# Patient Record
Sex: Male | Born: 1960
Health system: Southern US, Community
[De-identification: ages and names within clinical notes are randomized; demographics above are authoritative.]

## PROBLEM LIST (undated history)

## (undated) ENCOUNTER — Ambulatory Visit

## (undated) ENCOUNTER — Ambulatory Visit: Attending: Pharmacist | Primary: Pharmacist

## (undated) DIAGNOSIS — H539 Unspecified visual disturbance: Secondary | ICD-10-CM

## (undated) DIAGNOSIS — F909 Attention-deficit hyperactivity disorder, unspecified type: Secondary | ICD-10-CM

## (undated) DIAGNOSIS — A549 Gonococcal infection, unspecified: Secondary | ICD-10-CM

## (undated) DIAGNOSIS — Z7251 High risk heterosexual behavior: Principal | ICD-10-CM

## (undated) DIAGNOSIS — R51 Headache: Secondary | ICD-10-CM

## (undated) DIAGNOSIS — Z701 Counseling related to patient's sexual behavior and orientation: Secondary | ICD-10-CM

## (undated) DIAGNOSIS — E039 Hypothyroidism, unspecified: Secondary | ICD-10-CM

## (undated) DIAGNOSIS — Z206 Contact with and (suspected) exposure to human immunodeficiency virus [HIV]: Secondary | ICD-10-CM

## (undated) DIAGNOSIS — Z113 Encounter for screening for infections with a predominantly sexual mode of transmission: Secondary | ICD-10-CM

## (undated) DIAGNOSIS — R519 Headache, unspecified: Secondary | ICD-10-CM

## (undated) DIAGNOSIS — E785 Hyperlipidemia, unspecified: Secondary | ICD-10-CM

## (undated) DIAGNOSIS — R7989 Other specified abnormal findings of blood chemistry: Secondary | ICD-10-CM

## (undated) HISTORY — DX: Attention-deficit hyperactivity disorder, unspecified type: F90.9

## (undated) HISTORY — DX: Hyperlipidemia, unspecified: E78.5

## (undated) HISTORY — DX: Gonococcal infection, unspecified: A54.9

## (undated) HISTORY — DX: Hypothyroidism, unspecified: E03.9

## (undated) HISTORY — DX: Headache: R51

## (undated) HISTORY — PX: TONSILLECTOMY AND ADENOIDECTOMY: SUR1326

## (undated) HISTORY — DX: High risk heterosexual behavior: Z72.51

## (undated) HISTORY — DX: Counseling related to patient's sexual behavior and orientation: Z70.1

## (undated) HISTORY — DX: Unspecified visual disturbance: H53.9

## (undated) HISTORY — DX: Other specified abnormal findings of blood chemistry: R79.89

## (undated) HISTORY — PX: NASAL SINUS SURGERY: SHX719

## (undated) HISTORY — DX: Encounter for screening for infections with a predominantly sexual mode of transmission: Z11.3

## (undated) HISTORY — DX: Headache, unspecified: R51.9

## (undated) HISTORY — DX: Contact with and (suspected) exposure to human immunodeficiency virus (hiv): Z20.6

---

## 2011-09-06 DIAGNOSIS — L570 Actinic keratosis: Secondary | ICD-10-CM | POA: Insufficient documentation

## 2011-09-06 DIAGNOSIS — D229 Melanocytic nevi, unspecified: Secondary | ICD-10-CM | POA: Insufficient documentation

## 2014-08-25 ENCOUNTER — Ambulatory Visit (INDEPENDENT_AMBULATORY_CARE_PROVIDER_SITE_OTHER): Payer: BLUE CROSS/BLUE SHIELD | Admitting: Neurology

## 2014-08-25 ENCOUNTER — Encounter: Payer: Self-pay | Admitting: Neurology

## 2014-08-25 VITALS — BP 110/72 | HR 72 | Resp 14 | Ht 72.0 in | Wt 168.2 lb

## 2014-08-25 DIAGNOSIS — M542 Cervicalgia: Secondary | ICD-10-CM | POA: Diagnosis not present

## 2014-08-25 DIAGNOSIS — M545 Low back pain, unspecified: Secondary | ICD-10-CM

## 2014-08-25 DIAGNOSIS — R200 Anesthesia of skin: Secondary | ICD-10-CM | POA: Diagnosis not present

## 2014-08-25 DIAGNOSIS — M79605 Pain in left leg: Secondary | ICD-10-CM

## 2014-08-25 DIAGNOSIS — M4120 Other idiopathic scoliosis, site unspecified: Secondary | ICD-10-CM | POA: Diagnosis not present

## 2014-08-25 MED ORDER — METHYLPREDNISOLONE 4 MG PO TBPK: ORAL_TABLET | ORAL | Status: DC

## 2014-08-25 NOTE — Progress Notes (Signed)
[facility]  PATIENT: Richard Hahn DOB: Aug 08, 1960  REFERRING DOCTOR OR PCP:  Sandi Mariscal SOURCE: patient  _________________________________   HISTORICAL  CHIEF COMPLAINT:  Chief Complaint  Patient presents with  . Back Pain    [name] has a Hx. of scoliosis.  He is here today with c/o left lower back pain, upper back and neck pain.  Sts. yesterday he was standing still for about 10-15 min, when he went to move he  had onset of dull ache, tingling entire left leg to foot.  Sx. are improved but still present today.  He has a hx. of scoliosis--unsure of degree of curvature--and wonders if some of his back and neck pain is due to that/fim    HISTORY OF PRESENT ILLNESS:  I had the pleasure seeing your patient, Richard Hahn, at [facility] for neurologic consultation regarding his left leg numbness and weakness.   He is a 54 year old man in good general health who had the sudden onset left leg venous and numbness yesterday. He was standing up when he took a step forward and the left leg collapsed and he fell he felt severe numbness going down the left leg all the way to the foot this was associated with a dull ache but not significant pain. Numbness has persisted but is much less intense today than it was yesterday. He currently denies any weakness and just has a minimal sensation of numbness in the left leg but does not note any actual numbness when he touches the leg. He has not noted any change in bladder or bowel.  He also notes mild neck pain with some neck spasticity. The symptoms have been more constant recently it had not changed yesterday. He occasionally notes a grinding sound in the upper back when he moves.  He has a history of scoliosis and occasionally get some back discomfort. He has never had any spinal procedures  REVIEW OF SYSTEMS: Constitutional: No fevers, chills, sweats, or change in appetite Eyes: No visual changes, double vision, eye  pain Ear, nose and throat: No hearing loss, ear pain, nasal congestion, sore throat Cardiovascular: No chest pain, palpitations Respiratory: No shortness of breath at rest or with exertion.   No wheezes GastrointestinaI: No nausea, vomiting, diarrhea, abdominal pain, fecal incontinence Genitourinary: No dysuria, urinary retention or frequency.  No nocturia. Musculoskeletal: No neck pain, back pain Integumentary: No rash, pruritus, skin lesions Neurological: as above Psychiatric: No depression at this time.  No anxiety Endocrine: No palpitations, diaphoresis, change in appetite, change in weigh or increased thirst Hematologic/Lymphatic: No anemia, purpura, petechiae. Allergic/Immunologic: No itchy/runny eyes, nasal congestion, recent allergic reactions, rashes  ALLERGIES: Allergies  Allergen Reactions  . Penicillins Rash    HOME MEDICATIONS:  Current outpatient prescriptions:  .  ALPRAZolam (XANAX) 0.5 MG tablet, Take 0.5 mg by mouth at bedtime., Disp: , Rfl: 1 .  cyanocobalamin 100 MCG tablet, Take 100 mcg by mouth daily., Disp: , Rfl:  .  doxycycline (DORYX) 100 MG EC tablet, TAKE 1 (ONE) TABLET DR BY MOUTH TWO TIMES DAILY, Disp: , Rfl: 0 .  fluticasone (FLONASE) 50 MCG/ACT nasal spray, INSTILL 2 SPRAYS IN EACH NOSTRIL DAILY, Disp: , Rfl: 5 .  ipratropium (ATROVENT) 0.03 % nasal spray, USE 1 NASALLY TWICE DAILY, Disp: , Rfl: 5 .  Multiple Vitamin (MULTIVITAMIN) tablet, Take 1 tablet by mouth daily., Disp: , Rfl:  .  NATURE-THROID 32.5 MG tablet, TAKE TWICE DAILY ON EMPTY STOMACH, Disp: , Rfl: 3 .  omega-3 acid ethyl esters (LOVAZA) 1 G capsule, Take 2 capsules by mouth 2 (two) times daily., Disp: , Rfl: 3 .  temazepam (RESTORIL) 30 MG capsule, TK 1 C PO QD HS, Disp: , Rfl: 2 .  testosterone cypionate (DEPOTESTOTERONE CYPIONATE) 200 MG/ML injection, INJECT 0.8MLS INTO LATERAL THIGH ONCE WEEKLY, Disp: , Rfl: 1  PAST MEDICAL HISTORY: Past Medical History  Diagnosis Date  .  Headache   . Vision abnormalities     PAST SURGICAL HISTORY: Past Surgical History  Procedure Laterality Date  . Nasal sinus surgery    . Tonsillectomy and adenoidectomy      FAMILY HISTORY: Family History  Problem Relation Age of Onset  . High Cholesterol Mother   . High Cholesterol Father   . Skin cancer Father     SOCIAL HISTORY:  History   Social History  . Marital Status: Unknown    Spouse Name: N/A  . Number of Children: N/A  . Years of Education: N/A   Occupational History  . Not on file.   Social History Main Topics  . Smoking status: Never Smoker   . Smokeless tobacco: Not on file  . Alcohol Use: No  . Drug Use: No  . Sexual Activity: Not on file   Other Topics Concern  . Not on file   Social History Narrative  . No narrative on file     PHYSICAL EXAM  Filed Vitals:   08/25/14 1526  BP: 110/72  Pulse: 72  Resp: 14  Height: 6' (1.829 m)  Weight: 168 lb 3.2 oz (76.295 kg)    Body mass index is 22.81 kg/(m^2).   General: The patient is well-developed and well-nourished and in no acute distress  Neck: The neck is supple, no carotid bruits are noted.  The neck is nontender.  Cardiovascular: The heart has a regular rate and rhythm with a normal S1 and S2. There were no murmurs, gallops or rubs.   Skin: Extremities are without significant edema.  Musculoskeletal:  Back is nontender.   He has scoliosis most notable in the upper mid lumbar spine that is convex to the left and there is more gentle scoliosis in the thoracic spine convex to the right. The right hip is elevated, compared to the left.  Neurologic Exam  Mental status: The patient is alert and oriented x 3 at the time of the examination. The patient has apparent normal recent and remote memory, with an apparently normal attention span and concentration ability.   Speech is normal.  Cranial nerves: Extraocular movements are full.  Facial symmetry is present.  Trapezius and  sternocleidomastoid strength is normal. No dysarthria is noted.   No obvious hearing deficits are noted.  Motor:  Muscle bulk is normal.   Tone is normal. Strength is  5 / 5 in all 4 extremities.   Sensory: Sensory testing is intact to   touch and vibration sensation in all 4 extremities.  Coordination: Cerebellar testing reveals good finger-nose-finger bilaterally.  Gait and station: Station is normal.   Gait is normal. Tandem gait is normal. Romberg is negative.   Reflexes: Deep tendon reflexes are symmetric in the arms, the left knee reflex is brisker than the right and there is some spread to the other leg. The ankle reflexes are more symmetric.   Plantar responses are flexor.    DIAGNOSTIC DATA (LABS, IMAGING, TESTING) - I reviewed patient records, labs, notes, testing and imaging myself where available.     ASSESSMENT AND  PLAN  Neck pain - Plan: MR Cervical Spine Wo Contrast  Idiopathic scoliosis  Numbness - Plan: MR Lumbar Spine Wo Contrast, MR Cervical Spine Wo Contrast  Low back pain radiating to left lower extremity - Plan: MR Lumbar Spine Wo Contrast   In summary, Darris Staiger is a 54 year old man who had the onset of left leg numbness and transient weakness yesterday.   His exam is notable for significant scoliosis in the lumbar spine and for brisk left leg reflexes. Because of the combination of neck pain, neck stiffness and increased reflexes in the legs, we need to check an MRI of the cervical spine to make sure that there is not a compressive myelopathy explaining his symptoms in the legs. Additionally, because of the scoliosis and the numbness down the left leg we need to check an MRI of the lumbar spine. I have prescribed him a steroid pack. He will wake she'll tomorrow and not take it if his symptoms are better. He will also take a steroid pack with him when he travels.   We will call her with the results of the study and he will follow-up as needed.   Claudette Wermuth A.  Felecia Shelling, MD, PhD 2/68/3419, 6:22 PM Certified in Neurology, Clinical Neurophysiology, Sleep Medicine, Pain Medicine and Neuroimaging  [facility] [address] [place], [place] [address] [phone]

## 2014-09-18 ENCOUNTER — Ambulatory Visit
Admission: RE | Admit: 2014-09-18 | Discharge: 2014-09-18 | Disposition: A | Discharge status: Home / Self Care | Payer: BLUE CROSS/BLUE SHIELD | Source: Ambulatory Visit | Attending: Neurology | Admitting: Neurology

## 2014-09-18 DIAGNOSIS — M545 Low back pain, unspecified: Secondary | ICD-10-CM

## 2014-09-18 DIAGNOSIS — M542 Cervicalgia: Secondary | ICD-10-CM

## 2014-09-18 DIAGNOSIS — R200 Anesthesia of skin: Secondary | ICD-10-CM

## 2014-09-18 DIAGNOSIS — M79605 Pain in left leg: Secondary | ICD-10-CM

## 2015-04-06 ENCOUNTER — Ambulatory Visit (INDEPENDENT_AMBULATORY_CARE_PROVIDER_SITE_OTHER): Payer: BLUE CROSS/BLUE SHIELD | Admitting: Infectious Disease

## 2015-04-06 ENCOUNTER — Encounter: Payer: Self-pay | Admitting: Infectious Disease

## 2015-04-06 VITALS — Ht 73.0 in | Wt 166.0 lb

## 2015-04-06 DIAGNOSIS — Z7289 Other problems related to lifestyle: Secondary | ICD-10-CM | POA: Diagnosis not present

## 2015-04-06 DIAGNOSIS — Z7251 High risk heterosexual behavior: Secondary | ICD-10-CM

## 2015-04-06 DIAGNOSIS — E039 Hypothyroidism, unspecified: Secondary | ICD-10-CM | POA: Insufficient documentation

## 2015-04-06 DIAGNOSIS — R7989 Other specified abnormal findings of blood chemistry: Secondary | ICD-10-CM | POA: Insufficient documentation

## 2015-04-06 DIAGNOSIS — F909 Attention-deficit hyperactivity disorder, unspecified type: Secondary | ICD-10-CM | POA: Insufficient documentation

## 2015-04-06 DIAGNOSIS — Z113 Encounter for screening for infections with a predominantly sexual mode of transmission: Secondary | ICD-10-CM | POA: Diagnosis not present

## 2015-04-06 HISTORY — DX: High risk heterosexual behavior: Z72.51

## 2015-04-06 HISTORY — DX: Encounter for screening for infections with a predominantly sexual mode of transmission: Z11.3

## 2015-04-06 LAB — COMPLETE METABOLIC PANEL WITH GFR
ALT: 34 U/L (ref 9–46)
AST: 25 U/L (ref 10–35)
Albumin: 4.9 g/dL (ref 3.6–5.1)
Alkaline Phosphatase: 68 U/L (ref 40–115)
BILIRUBIN TOTAL: 1.8 mg/dL — AB (ref 0.2–1.2)
BUN: 14 mg/dL (ref 7–25)
CALCIUM: 9.5 mg/dL (ref 8.6–10.3)
CO2: 27 mmol/L (ref 20–31)
Chloride: 99 mmol/L (ref 98–110)
Creat: 1.13 mg/dL (ref 0.70–1.33)
GFR, EST AFRICAN AMERICAN: 84 mL/min (ref >=60)
GFR, Est Non African American: 72 mL/min (ref >=60)
Glucose, Bld: 100 mg/dL — ABNORMAL HIGH (ref 65–99)
Potassium: 4.3 mmol/L (ref 3.5–5.3)
Sodium: 137 mmol/L (ref 135–146)
Total Protein: 6.8 g/dL (ref 6.1–8.1)

## 2015-04-06 LAB — HIV ANTIBODY (ROUTINE TESTING W REFLEX): HIV: NONREACTIVE

## 2015-04-06 NOTE — Progress Notes (Signed)
Chief complaint: here to establish care for Pre-Exposure Prophylaxis  Subjective:    Patient ID: Richard Hahn, male    DOB: [birth year], 55 y.o.   MRN: [id number]  HPI  55 year old man who is currently going through separation from his wife but who also has sex with men.   He has  largely been with one male partner who is HIV + on meds and with undetectable viral load. He has had unprotected sex. Patient is largely a "top" and also has oral sex. He is not a "bottom." He is in process of going through a divorce.  He did oral test recently HIV negative and he also had an HIV test that was negative several months ago.  His partner is under care at [facility] and has had HIV for nearly 30 years and as mentioned has been consistently undetectable.  He is interested in going onto PrEP to prevent HIV.  I reviewed findings from the Colbert studies both of which showed ZERO transmission from HIV + individuals to their partners as long as the HIV + had VL <200.  I do regardless support idea of his starting on PrEP in particular if this patient has other sexual partners or is not sure if his HIV + partner is reliably on PrEP.  He has no hx of STD's, no hx of stimulant use.  Past Medical History  Diagnosis Date  . Headache   . Vision abnormalities   . High risk sexual behavior 04/06/2015  . ADHD (attention deficit hyperactivity disorder)   . Low testosterone   . Hyperlipidemia   . Hypothyroidism     Past Surgical History  Procedure Laterality Date  . Nasal sinus surgery    . Tonsillectomy and adenoidectomy      Family History  Problem Relation Age of Onset  . High Cholesterol Mother   . High Cholesterol Father   . Skin cancer Father       Social History   Social History  . Marital Status: Unknown    Spouse Name: N/A  . Number of Children: N/A  . Years of Education: N/A   Social History Main Topics  . Smoking status: Never Smoker   . Smokeless tobacco: None    . Alcohol Use: No  . Drug Use: No  . Sexual Activity: Not Asked   Other Topics Concern  . None   Social History Narrative    Allergies  Allergen Reactions  . Penicillins Rash     Current outpatient prescriptions:  .  ALPRAZolam (XANAX) 0.5 MG tablet, Take 0.5 mg by mouth at bedtime., Disp: , Rfl: 1 .  fluticasone (FLONASE) 50 MCG/ACT nasal spray, INSTILL 2 SPRAYS IN EACH NOSTRIL DAILY, Disp: , Rfl: 5 .  Multiple Vitamin (MULTIVITAMIN) tablet, Take 1 tablet by mouth daily., Disp: , Rfl:  .  NATURE-THROID 32.5 MG tablet, TAKE TWICE DAILY ON EMPTY STOMACH, Disp: , Rfl: 3 .  omega-3 acid ethyl esters (LOVAZA) 1 G capsule, Take 2 capsules by mouth 2 (two) times daily., Disp: , Rfl: 3 .  testosterone cypionate (DEPOTESTOTERONE CYPIONATE) 200 MG/ML injection, INJECT 0.8MLS INTO LATERAL THIGH ONCE WEEKLY, Disp: , Rfl: 1 .  VYVANSE 30 MG capsule, , Disp: , Rfl: 0    Review of Systems  Constitutional: Negative for fever, chills, diaphoresis, activity change, appetite change, fatigue and unexpected weight change.  HENT: Negative for congestion, rhinorrhea, sinus pressure, sneezing, sore throat and trouble swallowing.   Eyes:  Negative for photophobia and visual disturbance.  Respiratory: Negative for cough, chest tightness, shortness of breath, wheezing and stridor.   Cardiovascular: Negative for chest pain, palpitations and leg swelling.  Gastrointestinal: Negative for nausea, vomiting, abdominal pain, diarrhea, constipation, blood in stool, abdominal distention and anal bleeding.  Genitourinary: Negative for dysuria, hematuria, flank pain and difficulty urinating.  Musculoskeletal: Negative for myalgias, back pain, joint swelling, arthralgias and gait problem.  Skin: Negative for color change, pallor, rash and wound.  Neurological: Negative for dizziness, tremors, weakness and light-headedness.  Hematological: Negative for adenopathy. Does not bruise/bleed easily.   Psychiatric/Behavioral: Negative for behavioral problems, confusion, sleep disturbance, dysphoric mood, decreased concentration and agitation.       Objective:   Physical Exam  Constitutional: He is oriented to person, place, and time. He appears well-developed and well-nourished.  HENT:  Head: Normocephalic and atraumatic.  Eyes: Conjunctivae and EOM are normal.  Neck: Normal range of motion. Neck supple.  Cardiovascular: Normal rate and regular rhythm.   Pulmonary/Chest: Effort normal. No respiratory distress. He has no wheezes.  Abdominal: Soft. He exhibits no distension.  Musculoskeletal: Normal range of motion. He exhibits no edema or tenderness.  Neurological: He is alert and oriented to person, place, and time.  Skin: Skin is warm and dry. No rash noted. No erythema. No pallor.  Psychiatric: His behavior is normal. Judgment and thought content normal. His mood appears anxious.          Assessment & Plan:   "High risk sexual behavior", Screening for STD, need for PrEP  --I am checking OP, rectal and urine for GC and chlamydia, serum for RPR, hepatitis serologies, HIV by 4th generation test, CMP  --if he is HIV negative and other labs show safe to start PrEP I will start him on Truvada  He will then RTC in early March to see me  I had offered to have him seen by pharmacist PreP program but he prefers to limit the providers who see him to protect his confidentiality   I spent greater than 45 minutes with the patient including greater than 50% of time in face to face counsel of the patient re PrEP, HIV transmission risks, methods to prevent HIV and other STI's and in coordination of his care.

## 2015-04-07 ENCOUNTER — Other Ambulatory Visit: Payer: Self-pay | Admitting: *Deleted

## 2015-04-07 ENCOUNTER — Telehealth: Payer: Self-pay | Admitting: Infectious Disease

## 2015-04-07 LAB — HEPATITIS B SURFACE ANTIGEN: Hepatitis B Surface Ag: NEGATIVE

## 2015-04-07 LAB — RPR

## 2015-04-07 LAB — HEPATITIS C ANTIBODY: HCV Ab: NEGATIVE

## 2015-04-07 LAB — URINE CYTOLOGY ANCILLARY ONLY
CHLAMYDIA, DNA PROBE: NEGATIVE
NEISSERIA GONORRHEA: NEGATIVE

## 2015-04-07 LAB — CYTOLOGY, (ORAL, ANAL, URETHRAL) ANCILLARY ONLY
CHLAMYDIA, DNA PROBE: NEGATIVE
Chlamydia: NEGATIVE
NEISSERIA GONORRHEA: NEGATIVE
Neisseria Gonorrhea: NEGATIVE

## 2015-04-07 LAB — HEPATITIS A ANTIBODY, TOTAL: HEP A TOTAL AB: NONREACTIVE

## 2015-04-07 LAB — HEPATITIS B SURFACE ANTIBODY, QUANTITATIVE: Hepatitis B-Post: 0 m[IU]/mL

## 2015-04-07 MED ORDER — EMTRICITABINE-TENOFOVIR DF 200-300 MG PO TABS: 1.0000 | ORAL_TABLET | Freq: Every day | ORAL | Status: DC

## 2015-04-07 NOTE — Telephone Encounter (Signed)
Relayed results.  Patient will come 1/25 for nurse visit to start the hepatitis series. Landis Gandy, RN

## 2015-04-07 NOTE — Telephone Encounter (Signed)
Patient's 4th generation assay is negative for HIV  He can start on Truvada  He does need vaccination for Hep A and Hep B and needs to see me in early March for followup

## 2015-04-07 NOTE — Telephone Encounter (Signed)
Perfect thanks Michelle 

## 2015-04-08 ENCOUNTER — Ambulatory Visit (INDEPENDENT_AMBULATORY_CARE_PROVIDER_SITE_OTHER): Payer: BLUE CROSS/BLUE SHIELD | Admitting: *Deleted

## 2015-04-08 DIAGNOSIS — Z23 Encounter for immunization: Secondary | ICD-10-CM | POA: Diagnosis not present

## 2015-05-25 ENCOUNTER — Telehealth: Payer: Self-pay | Admitting: *Deleted

## 2015-05-25 ENCOUNTER — Other Ambulatory Visit: Payer: Self-pay | Admitting: Infectious Disease

## 2015-05-25 NOTE — Telephone Encounter (Signed)
Provided he keeps his followup appt with me

## 2015-05-25 NOTE — Telephone Encounter (Signed)
Patient

## 2015-05-25 NOTE — Telephone Encounter (Signed)
Patient called to refill Truvada until his next appt on 06/04/15. Please advise

## 2015-05-26 ENCOUNTER — Other Ambulatory Visit: Payer: Self-pay | Admitting: Infectious Disease

## 2015-05-26 MED ORDER — EMTRICITABINE-TENOFOVIR DF 200-300 MG PO TABS: 1.0000 | ORAL_TABLET | Freq: Every day | ORAL | Status: DC

## 2015-05-26 NOTE — Telephone Encounter (Signed)
Dr. Tommy Medal, can you please send his Rx; as his chart is blocked some kind of way and I am unable to do this. Thanks Myrtis Hopping

## 2015-05-26 NOTE — Telephone Encounter (Signed)
Just sent it in

## 2015-05-27 ENCOUNTER — Ambulatory Visit: Payer: BLUE CROSS/BLUE SHIELD | Admitting: Infectious Disease

## 2015-06-02 ENCOUNTER — Ambulatory Visit: Payer: BLUE CROSS/BLUE SHIELD | Admitting: Infectious Disease

## 2015-06-04 ENCOUNTER — Ambulatory Visit (INDEPENDENT_AMBULATORY_CARE_PROVIDER_SITE_OTHER): Payer: BLUE CROSS/BLUE SHIELD | Admitting: Infectious Disease

## 2015-06-04 ENCOUNTER — Encounter: Payer: Self-pay | Admitting: Infectious Disease

## 2015-06-04 VITALS — BP 141/90 | HR 96 | Temp 98.6°F | Wt 168.0 lb

## 2015-06-04 DIAGNOSIS — Z113 Encounter for screening for infections with a predominantly sexual mode of transmission: Secondary | ICD-10-CM | POA: Diagnosis not present

## 2015-06-04 DIAGNOSIS — Z23 Encounter for immunization: Secondary | ICD-10-CM

## 2015-06-04 DIAGNOSIS — Z7251 High risk heterosexual behavior: Secondary | ICD-10-CM | POA: Diagnosis not present

## 2015-06-04 DIAGNOSIS — Z7289 Other problems related to lifestyle: Secondary | ICD-10-CM | POA: Diagnosis not present

## 2015-06-04 MED ORDER — EMTRICITABINE-TENOFOVIR DF 200-300 MG PO TABS: 1.0000 | ORAL_TABLET | Freq: Every day | ORAL | Status: DC

## 2015-06-04 MED ORDER — HEPATITIS B VAC RECOMBINANT 10 MCG/ML IJ SUSP: 1.0000 mL | Freq: Once | INTRAMUSCULAR | Status: DC

## 2015-06-04 NOTE — Progress Notes (Signed)
Chief complaint: here for follow-up for Pre-Exposure Prophylaxis  Subjective:    Patient ID: Richard Hahn, male    DOB: November 29, 1960, 55 y.o.   MRN: [id number]  HPI   55 year old man who is currently going through separation from his wife but who also has sex with men.   He has  largely been with one male partner who is HIV + on meds and with undetectable viral load. He has had unprotected sex. Patient is largely a "top" and also has oral sex. He is not a "bottom." He is in process of going through a divorce.  He did oral test recently HIV negative and he also had an HIV test that was negative several months ago.  His partner is under care at [facility] and has had HIV for nearly 30 years --> now [place] and as mentioned has been consistently undetectable.  He is interested in going onto PrEP to prevent HIV. He tested negative for HIV and was started on Truvada which he is tolerating fairly well. He did have some occasional myalgias but doubtful to me to have anything to due with Truvada. He is still with the same partner. No other partners.   Past Medical History  Diagnosis Date  . Headache   . Vision abnormalities   . High risk sexual behavior 04/06/2015  . ADHD (attention deficit hyperactivity disorder)   . Low testosterone   . Hyperlipidemia   . Hypothyroidism   . Screen for STD (sexually transmitted disease) 04/06/2015    Past Surgical History  Procedure Laterality Date  . Nasal sinus surgery    . Tonsillectomy and adenoidectomy      Family History  Problem Relation Age of Onset  . High Cholesterol Mother   . High Cholesterol Father   . Skin cancer Father       Social History   Social History  . Marital Status: Unknown    Spouse Name: N/A  . Number of Children: N/A  . Years of Education: N/A   Social History Main Topics  . Smoking status: Never Smoker   . Smokeless tobacco: None  . Alcohol Use: No  . Drug Use: No  . Sexual Activity: Not Asked   Other  Topics Concern  . None   Social History Narrative    Allergies  Allergen Reactions  . Penicillins Rash     Current outpatient prescriptions:  .  ALPRAZolam (XANAX) 0.5 MG tablet, Take 0.5 mg by mouth at bedtime., Disp: , Rfl: 1 .  emtricitabine-tenofovir (TRUVADA) 200-300 MG tablet, Take 1 tablet by mouth daily., Disp: 30 tablet, Rfl: 0 .  fluticasone (FLONASE) 50 MCG/ACT nasal spray, INSTILL 2 SPRAYS IN EACH NOSTRIL DAILY, Disp: , Rfl: 5 .  Multiple Vitamin (MULTIVITAMIN) tablet, Take 1 tablet by mouth daily., Disp: , Rfl:  .  NATURE-THROID 32.5 MG tablet, TAKE TWICE DAILY ON EMPTY STOMACH, Disp: , Rfl: 3 .  omega-3 acid ethyl esters (LOVAZA) 1 G capsule, Take 2 capsules by mouth 2 (two) times daily., Disp: , Rfl: 3 .  testosterone cypionate (DEPOTESTOTERONE CYPIONATE) 200 MG/ML injection, INJECT 0.8MLS INTO LATERAL THIGH ONCE WEEKLY, Disp: , Rfl: 1 .  VYVANSE 30 MG capsule, , Disp: , Rfl: 0    Review of Systems  Constitutional: Negative for fever, chills, diaphoresis, activity change, appetite change, fatigue and unexpected weight change.  HENT: Negative for congestion, rhinorrhea, sinus pressure, sneezing, sore throat and trouble swallowing.   Eyes: Negative for photophobia and visual disturbance.  Respiratory: Negative for cough, chest tightness, shortness of breath, wheezing and stridor.   Cardiovascular: Negative for chest pain, palpitations and leg swelling.  Gastrointestinal: Negative for nausea, vomiting, abdominal pain, diarrhea, constipation, blood in stool, abdominal distention and anal bleeding.  Genitourinary: Negative for dysuria, hematuria, flank pain and difficulty urinating.  Musculoskeletal: Positive for myalgias. Negative for back pain, joint swelling, arthralgias and gait problem.  Skin: Negative for color change, pallor, rash and wound.  Neurological: Negative for dizziness, tremors, weakness and light-headedness.  Hematological: Negative for adenopathy. Does  not bruise/bleed easily.  Psychiatric/Behavioral: Negative for behavioral problems, confusion, sleep disturbance, dysphoric mood, decreased concentration and agitation.       Objective:   Physical Exam  Constitutional: He is oriented to person, place, and time. He appears well-developed and well-nourished.  HENT:  Head: Normocephalic and atraumatic.  Eyes: Conjunctivae and EOM are normal.  Neck: Normal range of motion. Neck supple.  Cardiovascular: Normal rate and regular rhythm.   Pulmonary/Chest: Effort normal. No respiratory distress. He has no wheezes.  Abdominal: Soft. He exhibits no distension.  Musculoskeletal: Normal range of motion. He exhibits no edema or tenderness.  Neurological: He is alert and oriented to person, place, and time.  Skin: Skin is warm and dry. No rash noted. No erythema. No pallor.  Psychiatric: His behavior is normal. Judgment and thought content normal.          Assessment & Plan:   "High risk sexual behavior", "problems related to lifestyle" Screening for STD, need for PrEP  --Recheck OP, rectal and urine for GC and chlamydia, serum for RPR, hepatitis serologies, HIV by 4th generation test, BMP  --if he is HIV negative and other labs show safe to start PrEP I will start him on Truvada  If negative renew Truvada and RTC in 3 months  Need for Hep A and B vaccines. Give him Hep B #2 today and then Hep A and B at 6 month time point

## 2015-06-04 NOTE — Addendum Note (Signed)
Addended by: Rodman Key A on: 06/04/2015 05:28 PM   Modules accepted: Orders

## 2015-06-05 LAB — BASIC METABOLIC PANEL WITH GFR
BUN: 14 mg/dL (ref 7–25)
CALCIUM: 9.5 mg/dL (ref 8.6–10.3)
CHLORIDE: 100 mmol/L (ref 98–110)
CO2: 27 mmol/L (ref 20–31)
CREATININE: 1.13 mg/dL (ref 0.70–1.33)
GFR, EST AFRICAN AMERICAN: 84 mL/min (ref >=60)
GFR, Est Non African American: 72 mL/min (ref >=60)
Glucose, Bld: 91 mg/dL (ref 65–99)
Potassium: 4.5 mmol/L (ref 3.5–5.3)
SODIUM: 137 mmol/L (ref 135–146)

## 2015-06-05 LAB — HIV ANTIBODY (ROUTINE TESTING W REFLEX): HIV: NONREACTIVE

## 2015-06-06 LAB — RPR

## 2015-06-08 LAB — URINE CYTOLOGY ANCILLARY ONLY
Chlamydia: NEGATIVE
NEISSERIA GONORRHEA: NEGATIVE

## 2015-08-25 ENCOUNTER — Other Ambulatory Visit: Payer: Self-pay | Admitting: Neurology

## 2015-08-26 ENCOUNTER — Other Ambulatory Visit: Payer: Self-pay | Admitting: Neurology

## 2015-08-31 ENCOUNTER — Encounter: Payer: Self-pay | Admitting: Infectious Disease

## 2015-08-31 ENCOUNTER — Ambulatory Visit (INDEPENDENT_AMBULATORY_CARE_PROVIDER_SITE_OTHER): Payer: BLUE CROSS/BLUE SHIELD | Admitting: Infectious Disease

## 2015-08-31 VITALS — BP 143/81 | HR 80 | Temp 97.7°F | Ht 73.0 in | Wt 170.0 lb

## 2015-08-31 DIAGNOSIS — Z23 Encounter for immunization: Secondary | ICD-10-CM

## 2015-08-31 DIAGNOSIS — Z7289 Other problems related to lifestyle: Secondary | ICD-10-CM | POA: Diagnosis not present

## 2015-08-31 DIAGNOSIS — Z7251 High risk heterosexual behavior: Secondary | ICD-10-CM | POA: Diagnosis not present

## 2015-08-31 DIAGNOSIS — Z113 Encounter for screening for infections with a predominantly sexual mode of transmission: Secondary | ICD-10-CM

## 2015-08-31 LAB — BASIC METABOLIC PANEL WITH GFR
BUN: 10 mg/dL (ref 7–25)
CHLORIDE: 103 mmol/L (ref 98–110)
CO2: 28 mmol/L (ref 20–31)
CREATININE: 1.17 mg/dL (ref 0.70–1.33)
Calcium: 9.1 mg/dL (ref 8.6–10.3)
GFR, Est African American: 80 mL/min (ref >=60)
GFR, Est Non African American: 69 mL/min (ref >=60)
GLUCOSE: 80 mg/dL (ref 65–99)
Potassium: 4.7 mmol/L (ref 3.5–5.3)
SODIUM: 139 mmol/L (ref 135–146)

## 2015-08-31 LAB — HIV ANTIBODY (ROUTINE TESTING W REFLEX): HIV 1&2 Ab, 4th Generation: NONREACTIVE

## 2015-08-31 NOTE — Progress Notes (Signed)
Chief complaint: here for follow-up for Pre-Exposure Prophylaxis  Subjective:    Patient ID: Richard Hahn, male    DOB: January 21, 1961, 55 y.o.   MRN: [id number]  HPI   55 year old man who is currently going through separation from his wife but who also has sex with men.   He has  largely been with one male partner who is HIV + on meds and with undetectable viral load.    His partner is under care at [facility] and has had HIV for nearly 30 years --> now [place] and as mentioned has been consistently undetectable.   He is tolerating his Truvada without problems. He has had no new sexual partners.   Past Medical History  Diagnosis Date  . Headache   . Vision abnormalities   . High risk sexual behavior 04/06/2015  . ADHD (attention deficit hyperactivity disorder)   . Low testosterone   . Hyperlipidemia   . Hypothyroidism   . Screen for STD (sexually transmitted disease) 04/06/2015    Past Surgical History  Procedure Laterality Date  . Nasal sinus surgery    . Tonsillectomy and adenoidectomy      Family History  Problem Relation Age of Onset  . High Cholesterol Mother   . High Cholesterol Father   . Skin cancer Father       Social History   Social History  . Marital Status: Unknown    Spouse Name: N/A  . Number of Children: N/A  . Years of Education: N/A   Social History Main Topics  . Smoking status: Never Smoker   . Smokeless tobacco: Never Used  . Alcohol Use: No  . Drug Use: No  . Sexual Activity: Not Asked   Other Topics Concern  . None   Social History Narrative    Allergies  Allergen Reactions  . Penicillins Rash     Current outpatient prescriptions:  .  ALPRAZolam (XANAX) 0.5 MG tablet, Take 0.5 mg by mouth at bedtime., Disp: , Rfl: 1 .  emtricitabine-tenofovir (TRUVADA) 200-300 MG tablet, Take 1 tablet by mouth daily., Disp: 30 tablet, Rfl: 2 .  fluticasone (FLONASE) 50 MCG/ACT nasal spray, INSTILL 2 SPRAYS IN EACH NOSTRIL DAILY, Disp: ,  Rfl: 5 .  Multiple Vitamin (MULTIVITAMIN) tablet, Take 1 tablet by mouth daily., Disp: , Rfl:  .  NATURE-THROID 32.5 MG tablet, TAKE TWICE DAILY ON EMPTY STOMACH, Disp: , Rfl: 3 .  omega-3 acid ethyl esters (LOVAZA) 1 G capsule, Take 2 capsules by mouth 2 (two) times daily., Disp: , Rfl: 3 .  testosterone cypionate (DEPOTESTOTERONE CYPIONATE) 200 MG/ML injection, INJECT 0.8MLS INTO LATERAL THIGH ONCE WEEKLY, Disp: , Rfl: 1 .  VYVANSE 30 MG capsule, , Disp: , Rfl: 0    Review of Systems  Constitutional: Negative for fever, chills, diaphoresis, activity change, appetite change, fatigue and unexpected weight change.  HENT: Negative for congestion, rhinorrhea, sinus pressure, sneezing, sore throat and trouble swallowing.   Eyes: Negative for photophobia and visual disturbance.  Respiratory: Negative for cough, chest tightness, shortness of breath, wheezing and stridor.   Cardiovascular: Negative for chest pain, palpitations and leg swelling.  Gastrointestinal: Negative for nausea, vomiting, abdominal pain, diarrhea, constipation, blood in stool, abdominal distention and anal bleeding.  Genitourinary: Negative for dysuria, hematuria, flank pain and difficulty urinating.  Musculoskeletal: Positive for myalgias. Negative for back pain, joint swelling, arthralgias and gait problem.  Skin: Negative for color change, pallor, rash and wound.  Neurological: Negative for dizziness, tremors, weakness  and light-headedness.  Hematological: Negative for adenopathy. Does not bruise/bleed easily.  Psychiatric/Behavioral: Negative for behavioral problems, confusion, sleep disturbance, dysphoric mood, decreased concentration and agitation.       Objective:   Physical Exam  Constitutional: He is oriented to person, place, and time. He appears well-developed and well-nourished.  HENT:  Head: Normocephalic and atraumatic.  Eyes: Conjunctivae and EOM are normal.  Neck: Normal range of motion. Neck supple.    Cardiovascular: Normal rate and regular rhythm.   Pulmonary/Chest: Effort normal. No respiratory distress. He has no wheezes.  Abdominal: Soft. He exhibits no distension.  Musculoskeletal: Normal range of motion. He exhibits no edema or tenderness.  Neurological: He is alert and oriented to person, place, and time.  Skin: Skin is warm and dry. No rash noted. No erythema. No pallor.  Psychiatric: His behavior is normal. Judgment and thought content normal.          Assessment & Plan:   "High risk sexual behavior", "problems related to lifestyle" Screening for STD, need for PrEP  --Recheck OP, rectal and urine for GC and chlamydia, serum for RPR, hepatitis serologies, HIV by 4th generation test, BMP  --if he is HIV negative and other labs show safe renew PrEP with Truvada    Need for Hep A and B vaccines. Give him Hep B #3  today and then Hep A #2 today

## 2015-08-31 NOTE — Addendum Note (Signed)
Addended by: Myrtis Hopping A on: 08/31/2015 11:04 AM   Modules accepted: Orders

## 2015-09-01 ENCOUNTER — Telehealth: Payer: Self-pay | Admitting: Infectious Disease

## 2015-09-01 LAB — URINE CYTOLOGY ANCILLARY ONLY
CHLAMYDIA, DNA PROBE: NEGATIVE
Neisseria Gonorrhea: NEGATIVE

## 2015-09-01 LAB — CYTOLOGY, (ORAL, ANAL, URETHRAL) ANCILLARY ONLY
CHLAMYDIA, DNA PROBE: NEGATIVE
CHLAMYDIA, DNA PROBE: NEGATIVE
NEISSERIA GONORRHEA: NEGATIVE
Neisseria Gonorrhea: NEGATIVE

## 2015-09-01 LAB — RPR

## 2015-09-01 MED ORDER — EMTRICITABINE-TENOFOVIR DF 200-300 MG PO TABS: 1.0000 | ORAL_TABLET | Freq: Every day | ORAL | Status: DC

## 2015-09-01 NOTE — Telephone Encounter (Signed)
Mr Mockler HIV negative. I have sent new refill for PrEP

## 2015-09-01 NOTE — Telephone Encounter (Signed)
Perfect

## 2015-09-01 NOTE — Telephone Encounter (Signed)
Patient notified

## 2015-11-30 ENCOUNTER — Ambulatory Visit: Payer: BLUE CROSS/BLUE SHIELD | Admitting: Infectious Disease

## 2016-01-04 ENCOUNTER — Other Ambulatory Visit: Payer: Self-pay | Admitting: Infectious Disease

## 2016-01-25 ENCOUNTER — Other Ambulatory Visit: Payer: Self-pay | Admitting: Infectious Disease

## 2016-02-17 ENCOUNTER — Other Ambulatory Visit: Payer: Self-pay | Admitting: *Deleted

## 2016-02-17 DIAGNOSIS — Z7251 High risk heterosexual behavior: Secondary | ICD-10-CM

## 2016-02-17 MED ORDER — EMTRICITABINE-TENOFOVIR DF 200-300 MG PO TABS: 1.0000 | ORAL_TABLET | Freq: Every day | ORAL | 0 refills | Status: DC

## 2016-03-09 ENCOUNTER — Other Ambulatory Visit: Payer: Self-pay | Admitting: Infectious Disease

## 2016-03-09 ENCOUNTER — Other Ambulatory Visit: Payer: Self-pay

## 2016-03-09 DIAGNOSIS — Z7251 High risk heterosexual behavior: Secondary | ICD-10-CM

## 2016-03-17 ENCOUNTER — Ambulatory Visit (INDEPENDENT_AMBULATORY_CARE_PROVIDER_SITE_OTHER): Payer: BLUE CROSS/BLUE SHIELD | Admitting: Infectious Disease

## 2016-03-17 ENCOUNTER — Encounter: Payer: Self-pay | Admitting: Infectious Disease

## 2016-03-17 VITALS — BP 148/89 | HR 81 | Temp 98.0°F | Ht 73.0 in | Wt 168.0 lb

## 2016-03-17 DIAGNOSIS — Z113 Encounter for screening for infections with a predominantly sexual mode of transmission: Secondary | ICD-10-CM

## 2016-03-17 DIAGNOSIS — Z7289 Other problems related to lifestyle: Secondary | ICD-10-CM

## 2016-03-17 DIAGNOSIS — Z7251 High risk heterosexual behavior: Secondary | ICD-10-CM

## 2016-03-17 LAB — COMPLETE METABOLIC PANEL WITH GFR
ALT: 32 U/L (ref 9–46)
AST: 26 U/L (ref 10–35)
Albumin: 4.8 g/dL (ref 3.6–5.1)
Alkaline Phosphatase: 71 U/L (ref 40–115)
BILIRUBIN TOTAL: 2.1 mg/dL — AB (ref 0.2–1.2)
BUN: 14 mg/dL (ref 7–25)
CO2: 25 mmol/L (ref 20–31)
Calcium: 9.4 mg/dL (ref 8.6–10.3)
Chloride: 102 mmol/L (ref 98–110)
Creat: 1.08 mg/dL (ref 0.70–1.33)
GFR, EST AFRICAN AMERICAN: 88 mL/min (ref >=60)
GFR, EST NON AFRICAN AMERICAN: 76 mL/min (ref >=60)
GLUCOSE: 87 mg/dL (ref 65–99)
POTASSIUM: 4.2 mmol/L (ref 3.5–5.3)
SODIUM: 138 mmol/L (ref 135–146)
TOTAL PROTEIN: 6.7 g/dL (ref 6.1–8.1)

## 2016-03-17 LAB — CBC WITH DIFFERENTIAL/PLATELET
BASOS ABS: 43 {cells}/uL (ref 0–200)
Basophils Relative: 1 %
EOS PCT: 2 %
Eosinophils Absolute: 86 cells/uL (ref 15–500)
HCT: 48.9 % (ref 38.5–50.0)
Hemoglobin: 17.3 g/dL — ABNORMAL HIGH (ref 13.2–17.1)
LYMPHS ABS: 1376 {cells}/uL (ref 850–3900)
LYMPHS PCT: 32 %
MCH: 31.5 pg (ref 27.0–33.0)
MCHC: 35.4 g/dL (ref 32.0–36.0)
MCV: 89.1 fL (ref 80.0–100.0)
MONOS PCT: 8 %
MPV: 9.5 fL (ref 7.5–12.5)
Monocytes Absolute: 344 cells/uL (ref 200–950)
NEUTROS PCT: 57 %
Neutro Abs: 2451 cells/uL (ref 1500–7800)
PLATELETS: 168 10*3/uL (ref 140–400)
RBC: 5.49 MIL/uL (ref 4.20–5.80)
RDW: 13.7 % (ref 11.0–15.0)
WBC: 4.3 10*3/uL (ref 3.8–10.8)

## 2016-03-17 NOTE — Progress Notes (Signed)
Chief complaint: here for follow-up for Pre-Exposure Prophylaxis  Subjective:    Patient ID: Richard Hahn, male    DOB: 28-Aug-1960, 56 y.o.   MRN: [id number]  HPI  56 year old man who is currently gone through separation from his wife but who also has sex with men.   He has  largely been with one male partner who is HIV + on meds and with undetectable viral load.    His partner is under care at [facility] and nwo here. He has had HIV for nearly 30 years    Richard Hahn  is tolerating his Truvada without problems. He has had no new sexual partners.  He feels safer on Truvada despite the fact this is sexual partner is undetectable. He tells me his partners not always 100% reliable in taking his anti-retroviral medications and he feels safer while being on the Truvada. Note he has not been seen for 6 months and I told him it is important and critical that he not go without HIV testing for more than 3 months.  Past Medical History:  Diagnosis Date  . ADHD (attention deficit hyperactivity disorder)   . Headache   . High risk sexual behavior 04/06/2015  . Hyperlipidemia   . Hypothyroidism   . Low testosterone   . Screen for STD (sexually transmitted disease) 04/06/2015  . Vision abnormalities     Past Surgical History:  Procedure Laterality Date  . NASAL SINUS SURGERY    . TONSILLECTOMY AND ADENOIDECTOMY      Family History  Problem Relation Age of Onset  . High Cholesterol Mother   . High Cholesterol Father   . Skin cancer Father       Social History   Social History  . Marital status: Unknown    Spouse name: N/A  . Number of children: N/A  . Years of education: N/A   Social History Main Topics  . Smoking status: Never Smoker  . Smokeless tobacco: Never Used  . Alcohol use No  . Drug use: No  . Sexual activity: Not Asked   Other Topics Concern  . None   Social History Narrative  . None    Allergies  Allergen Reactions  . Penicillins Hives     Current  Outpatient Prescriptions:  .  ALPRAZolam (XANAX) 0.5 MG tablet, Take 0.5 mg by mouth at bedtime., Disp: , Rfl: 1 .  emtricitabine-tenofovir (TRUVADA) 200-300 MG tablet, Take 1 tablet by mouth daily., Disp: 30 tablet, Rfl: 0 .  fluticasone (FLONASE) 50 MCG/ACT nasal spray, INSTILL 2 SPRAYS IN EACH NOSTRIL DAILY, Disp: , Rfl: 5 .  Multiple Vitamin (MULTIVITAMIN) tablet, Take 1 tablet by mouth daily., Disp: , Rfl:  .  omega-3 acid ethyl esters (LOVAZA) 1 G capsule, Take 2 capsules by mouth 2 (two) times daily., Disp: , Rfl: 3 .  testosterone cypionate (DEPOTESTOTERONE CYPIONATE) 200 MG/ML injection, INJECT 0.8MLS INTO LATERAL THIGH ONCE WEEKLY, Disp: , Rfl: 1 .  VYVANSE 30 MG capsule, , Disp: , Rfl: 0 .  ARMOUR THYROID 15 MG tablet, TK 1 T PO D WITH 30 MG THYROID T, Disp: , Rfl: 1 .  ARMOUR THYROID 30 MG tablet, TK 1 T PO UTD WITH 15 MG T, Disp: , Rfl: 1 .  LIVALO 1 MG TABS, TK 1 T PO 1 TO 2 TIMES A WEEK, Disp: , Rfl: 2 .  SUMAtriptan (IMITREX) 100 MG tablet, TK 1 T PO PRF HA. MAY REPEAT DOSE ONCE IN 2 HOURS IF  NEEDED. MAX OF 2 TS PER DAY., Disp: , Rfl: 0 .  VIAGRA 100 MG tablet, TK 1 T PO  D PRN, Disp: , Rfl: 0    Review of Systems  Constitutional: Negative for activity change, appetite change, chills, diaphoresis, fatigue, fever and unexpected weight change.  HENT: Negative for congestion, rhinorrhea, sinus pressure, sneezing, sore throat and trouble swallowing.   Eyes: Negative for photophobia and visual disturbance.  Respiratory: Negative for cough, chest tightness, shortness of breath, wheezing and stridor.   Cardiovascular: Negative for chest pain, palpitations and leg swelling.  Gastrointestinal: Negative for abdominal distention, abdominal pain, anal bleeding, blood in stool, constipation, diarrhea, nausea and vomiting.  Genitourinary: Negative for difficulty urinating, dysuria, flank pain and hematuria.  Musculoskeletal: Positive for myalgias. Negative for arthralgias, back pain, gait  problem and joint swelling.  Skin: Negative for color change, pallor, rash and wound.  Neurological: Negative for dizziness, tremors, weakness and light-headedness.  Hematological: Negative for adenopathy. Does not bruise/bleed easily.  Psychiatric/Behavioral: Negative for agitation, behavioral problems, confusion, decreased concentration, dysphoric mood and sleep disturbance.       Objective:   Physical Exam  Constitutional: He is oriented to person, place, and time. He appears well-developed and well-nourished.  HENT:  Head: Normocephalic and atraumatic.  Eyes: Conjunctivae and EOM are normal.  Neck: Normal range of motion. Neck supple.  Cardiovascular: Normal rate and regular rhythm.   Pulmonary/Chest: Effort normal. No respiratory distress. He has no wheezes.  Abdominal: Soft. He exhibits no distension.  Musculoskeletal: Normal range of motion. He exhibits no edema or tenderness.  Neurological: He is alert and oriented to person, place, and time.  Skin: Skin is warm and dry. No rash noted. No erythema. No pallor.  Psychiatric: His behavior is normal. Judgment and thought content normal.          Assessment & Plan:   "High risk sexual behavior", "problems related to lifestyle" Screening for STD, need for PrEP  --Recheck OP, rectal and urine for GC and chlamydia, serum for RPR, hepatitis serologies, HIV by 4th generation test, BMP  --if he is HIV negative and other labs show safe renew PrEP with Truvada

## 2016-03-18 ENCOUNTER — Other Ambulatory Visit: Payer: Self-pay | Admitting: *Deleted

## 2016-03-18 DIAGNOSIS — Z7251 High risk heterosexual behavior: Secondary | ICD-10-CM

## 2016-03-18 LAB — RPR

## 2016-03-18 LAB — T-HELPER CELL (CD4) - (RCID CLINIC ONLY)
CD4 T CELL HELPER: 45 % (ref 33–55)
CD4 T Cell Abs: 720 /uL (ref 400–2700)

## 2016-03-18 LAB — HIV ANTIBODY (ROUTINE TESTING W REFLEX): HIV 1&2 Ab, 4th Generation: NONREACTIVE

## 2016-03-18 MED ORDER — EMTRICITABINE-TENOFOVIR DF 200-300 MG PO TABS: 1.0000 | ORAL_TABLET | Freq: Every day | ORAL | 3 refills | Status: DC

## 2016-03-21 ENCOUNTER — Other Ambulatory Visit: Payer: Self-pay | Admitting: Infectious Disease

## 2016-03-21 DIAGNOSIS — Z7251 High risk heterosexual behavior: Secondary | ICD-10-CM

## 2016-03-21 LAB — URINE CYTOLOGY ANCILLARY ONLY
Chlamydia: NEGATIVE
Neisseria Gonorrhea: NEGATIVE

## 2016-03-21 LAB — CYTOLOGY, (ORAL, ANAL, URETHRAL) ANCILLARY ONLY
CHLAMYDIA, DNA PROBE: NEGATIVE
CHLAMYDIA, DNA PROBE: NEGATIVE
NEISSERIA GONORRHEA: NEGATIVE
Neisseria Gonorrhea: NEGATIVE

## 2016-03-21 MED ORDER — EMTRICITABINE-TENOFOVIR DF 200-300 MG PO TABS: 1.0000 | ORAL_TABLET | Freq: Every day | ORAL | 2 refills | Status: DC

## 2016-06-17 ENCOUNTER — Other Ambulatory Visit: Payer: Self-pay | Admitting: *Deleted

## 2016-06-17 DIAGNOSIS — Z7251 High risk heterosexual behavior: Secondary | ICD-10-CM

## 2016-06-17 MED ORDER — EMTRICITABINE-TENOFOVIR DF 200-300 MG PO TABS: 1.0000 | ORAL_TABLET | Freq: Every day | ORAL | 0 refills | Status: DC

## 2016-06-20 ENCOUNTER — Telehealth: Payer: Self-pay | Admitting: *Deleted

## 2016-06-20 NOTE — Telephone Encounter (Signed)
Patient needs to make and keep appt with Pharmacist to refill Truvada rx.  Message left for the patient to call [facility] to make this appt and why.

## 2016-06-27 ENCOUNTER — Other Ambulatory Visit (HOSPITAL_COMMUNITY)
Admission: RE | Admit: 2016-06-27 | Discharge: 2016-06-27 | Disposition: A | Discharge status: Home / Self Care | Payer: BLUE CROSS/BLUE SHIELD | Source: Ambulatory Visit | Attending: Infectious Disease | Admitting: Infectious Disease

## 2016-06-27 ENCOUNTER — Ambulatory Visit (INDEPENDENT_AMBULATORY_CARE_PROVIDER_SITE_OTHER): Payer: BLUE CROSS/BLUE SHIELD | Admitting: Infectious Disease

## 2016-06-27 ENCOUNTER — Encounter: Payer: Self-pay | Admitting: Infectious Disease

## 2016-06-27 DIAGNOSIS — Z113 Encounter for screening for infections with a predominantly sexual mode of transmission: Secondary | ICD-10-CM | POA: Insufficient documentation

## 2016-06-27 DIAGNOSIS — Z206 Contact with and (suspected) exposure to human immunodeficiency virus [HIV]: Secondary | ICD-10-CM | POA: Insufficient documentation

## 2016-06-27 DIAGNOSIS — Z701 Counseling related to patient's sexual behavior and orientation: Secondary | ICD-10-CM | POA: Diagnosis not present

## 2016-06-27 HISTORY — DX: Counseling related to patient's sexual behavior and orientation: Z70.1

## 2016-06-27 HISTORY — DX: Contact with and (suspected) exposure to human immunodeficiency virus (hiv): Z20.6

## 2016-06-27 LAB — BASIC METABOLIC PANEL WITH GFR
BUN: 13 mg/dL (ref 7–25)
CALCIUM: 9.4 mg/dL (ref 8.6–10.3)
CO2: 29 mmol/L (ref 20–31)
Chloride: 101 mmol/L (ref 98–110)
Creat: 1.05 mg/dL (ref 0.70–1.33)
GFR, Est Non African American: 78 mL/min (ref >=60)
Glucose, Bld: 109 mg/dL — ABNORMAL HIGH (ref 65–99)
POTASSIUM: 4.4 mmol/L (ref 3.5–5.3)
Sodium: 139 mmol/L (ref 135–146)

## 2016-06-27 NOTE — Progress Notes (Signed)
Chief complaint: here for follow-up for Pre-Exposure Prophylaxis   Subjective:    Patient ID: Richard Hahn, male    DOB: [birth year], 58 y.o.   MRN: [id number]  HPI  56 year old man who has gone through separation from his wife but who also has sex with men.   He has  largely been with one male partner who is HIV + on meds and with undetectable viral load.    His partner is under care at [facility] and now here. Partner has had HIV for nearly 30 years    [name]  is tolerating his Truvada without problems. He has had no new sexual partners.  His partner was recently hospitalized with a scalp abscess with sepsis requiring surgery. He had been given cefepime and vancomycin and apparently had an adverse reaction to one of these antibiotics reportedly due to antecedent "onion allergy"? I am not familiar with this problem. He had questions re risk of his partner for infectino and I counseled so long as partner had healthy CD4 count there is no increased risk for infection.     Past Medical History:  Diagnosis Date  . ADHD (attention deficit hyperactivity disorder)   . Headache   . High risk sexual behavior 04/06/2015  . Hyperlipidemia   . Hypothyroidism   . Low testosterone   . Screen for STD (sexually transmitted disease) 04/06/2015  . Vision abnormalities     Past Surgical History:  Procedure Laterality Date  . NASAL SINUS SURGERY    . TONSILLECTOMY AND ADENOIDECTOMY      Family History  Problem Relation Age of Onset  . High Cholesterol Mother   . High Cholesterol Father   . Skin cancer Father       Social History   Social History  . Marital status: Unknown    Spouse name: N/A  . Number of children: N/A  . Years of education: N/A   Social History Main Topics  . Smoking status: Never Smoker  . Smokeless tobacco: Never Used  . Alcohol use No  . Drug use: No  . Sexual activity: Not on file   Other Topics Concern  . Not on file   Social History Narrative  . No  narrative on file    Allergies  Allergen Reactions  . Penicillins Hives     Current Outpatient Prescriptions:  .  ALPRAZolam (XANAX) 0.5 MG tablet, Take 0.5 mg by mouth at bedtime., Disp: , Rfl: 1 .  ARMOUR THYROID 15 MG tablet, TK 1 T PO D WITH 30 MG THYROID T, Disp: , Rfl: 1 .  ARMOUR THYROID 30 MG tablet, TK 1 T PO UTD WITH 15 MG T, Disp: , Rfl: 1 .  emtricitabine-tenofovir (TRUVADA) 200-300 MG tablet, Take 1 tablet by mouth daily., Disp: 30 tablet, Rfl: 0 .  fluticasone (FLONASE) 50 MCG/ACT nasal spray, INSTILL 2 SPRAYS IN EACH NOSTRIL DAILY, Disp: , Rfl: 5 .  LIVALO 1 MG TABS, TK 1 T PO 1 TO 2 TIMES A WEEK, Disp: , Rfl: 2 .  Multiple Vitamin (MULTIVITAMIN) tablet, Take 1 tablet by mouth daily., Disp: , Rfl:  .  omega-3 acid ethyl esters (LOVAZA) 1 G capsule, Take 2 capsules by mouth 2 (two) times daily., Disp: , Rfl: 3 .  SUMAtriptan (IMITREX) 100 MG tablet, TK 1 T PO PRF HA. MAY REPEAT DOSE ONCE IN 2 HOURS IF NEEDED. MAX OF 2 TS PER DAY., Disp: , Rfl: 0 .  testosterone cypionate (DEPOTESTOTERONE CYPIONATE)  200 MG/ML injection, INJECT 0.8MLS INTO LATERAL THIGH ONCE WEEKLY, Disp: , Rfl: 1 .  VIAGRA 100 MG tablet, TK 1 T PO  D PRN, Disp: , Rfl: 0 .  VYVANSE 30 MG capsule, , Disp: , Rfl: 0    Review of Systems  Constitutional: Negative for activity change, appetite change, chills, diaphoresis, fatigue, fever and unexpected weight change.  HENT: Negative for congestion, rhinorrhea, sinus pressure, sneezing, sore throat and trouble swallowing.   Eyes: Negative for photophobia and visual disturbance.  Respiratory: Negative for cough, chest tightness, shortness of breath, wheezing and stridor.   Cardiovascular: Negative for chest pain, palpitations and leg swelling.  Gastrointestinal: Negative for abdominal distention, abdominal pain, anal bleeding, blood in stool, constipation, diarrhea, nausea and vomiting.  Genitourinary: Negative for difficulty urinating, dysuria, flank pain and  hematuria.  Musculoskeletal: Negative for arthralgias, back pain, gait problem and joint swelling.  Skin: Negative for color change, pallor, rash and wound.  Neurological: Negative for dizziness, tremors, weakness and light-headedness.  Hematological: Negative for adenopathy. Does not bruise/bleed easily.  Psychiatric/Behavioral: Negative for agitation, behavioral problems, confusion, decreased concentration, dysphoric mood and sleep disturbance.       Objective:   Physical Exam  Constitutional: He is oriented to person, place, and time. He appears well-developed and well-nourished.  HENT:  Head: Normocephalic and atraumatic.  Eyes: Conjunctivae and EOM are normal.  Neck: Normal range of motion. Neck supple.  Cardiovascular: Normal rate and regular rhythm.   Pulmonary/Chest: Effort normal. No respiratory distress. He has no wheezes.  Abdominal: Soft. He exhibits no distension.  Musculoskeletal: Normal range of motion. He exhibits no edema or tenderness.  Neurological: He is alert and oriented to person, place, and time.  Skin: Skin is warm and dry. No rash noted. No erythema. No pallor.  Psychiatric: His behavior is normal. Judgment and thought content normal.          Assessment & Plan:   "High risk sexual behavior", exposure to patient with HIV,  "problems related to lifestyle" Screening for STD, need for PrEP  --Recheck OP, rectal and urine for GC and chlamydia, serum for RPR, hepatitis serologies, HIV by 4th generation test, BMP  --if he is HIV negative and other labs show safe renew PrEP with Truvada x 3 months and RTC  I spent greater than 25  minutes with the patient including greater than 50% of time in face to face counsel of the patient re HIV PrEP, STI counselling and in coordination of his care.

## 2016-06-28 ENCOUNTER — Other Ambulatory Visit: Payer: Self-pay | Admitting: Infectious Disease

## 2016-06-28 DIAGNOSIS — Z7251 High risk heterosexual behavior: Secondary | ICD-10-CM

## 2016-06-28 LAB — CYTOLOGY, (ORAL, ANAL, URETHRAL) ANCILLARY ONLY
Chlamydia: NEGATIVE
Chlamydia: NEGATIVE
NEISSERIA GONORRHEA: NEGATIVE
Neisseria Gonorrhea: NEGATIVE

## 2016-06-28 LAB — URINE CYTOLOGY ANCILLARY ONLY
Chlamydia: NEGATIVE
NEISSERIA GONORRHEA: NEGATIVE

## 2016-06-28 LAB — RPR

## 2016-06-28 LAB — HIV ANTIBODY (ROUTINE TESTING W REFLEX): HIV: NONREACTIVE

## 2016-06-28 MED ORDER — EMTRICITABINE-TENOFOVIR DF 200-300 MG PO TABS: 1.0000 | ORAL_TABLET | Freq: Every day | ORAL | 2 refills | Status: DC

## 2016-09-05 ENCOUNTER — Other Ambulatory Visit (HOSPITAL_COMMUNITY)
Admission: RE | Admit: 2016-09-05 | Discharge: 2016-09-05 | Disposition: A | Discharge status: Home / Self Care | Payer: BLUE CROSS/BLUE SHIELD | Source: Ambulatory Visit | Attending: Infectious Disease | Admitting: Infectious Disease

## 2016-09-05 ENCOUNTER — Telehealth: Payer: Self-pay | Admitting: *Deleted

## 2016-09-05 ENCOUNTER — Other Ambulatory Visit: Payer: BLUE CROSS/BLUE SHIELD

## 2016-09-05 DIAGNOSIS — Z7251 High risk heterosexual behavior: Secondary | ICD-10-CM

## 2016-09-05 DIAGNOSIS — Z113 Encounter for screening for infections with a predominantly sexual mode of transmission: Secondary | ICD-10-CM

## 2016-09-05 NOTE — Telephone Encounter (Signed)
Ok hopefully back in am

## 2016-09-05 NOTE — Telephone Encounter (Signed)
He came in for labs this afternoon.  Willing to wait for results.

## 2016-09-05 NOTE — Telephone Encounter (Signed)
Patient thinks he has an infection.   He had an unprotected oral/genital sexual encounter while out-of-town.  He now has milky penial discharge which is "very uncomfortable.  Appointment made for follow-up labs today.  He was told that the results would be back tomorrow but he is anxious to begin treatment due to being uncomfortable.  RN stated that she would pass along his concern to Dr. Tommy Medal.  Patient rescheduled his 09/19/16 appointment to 11/02/16 due to being out-of-town.

## 2016-09-05 NOTE — Telephone Encounter (Signed)
It he wants presumptive treatment then he should have Ceftriaxone IM injection 250mg  x1 now and oral azithromycin 1 gram for possible GC (and would also cover chlamydia)

## 2016-09-06 ENCOUNTER — Telehealth: Payer: Self-pay

## 2016-09-06 LAB — COMPREHENSIVE METABOLIC PANEL
ALBUMIN: 4.7 g/dL (ref 3.6–5.1)
ALT: 22 U/L (ref 9–46)
AST: 22 U/L (ref 10–35)
Alkaline Phosphatase: 74 U/L (ref 40–115)
BILIRUBIN TOTAL: 1.8 mg/dL — AB (ref 0.2–1.2)
BUN: 19 mg/dL (ref 7–25)
CALCIUM: 9.6 mg/dL (ref 8.6–10.3)
CHLORIDE: 105 mmol/L (ref 98–110)
CO2: 24 mmol/L (ref 20–31)
CREATININE: 1.19 mg/dL (ref 0.70–1.33)
Glucose, Bld: 103 mg/dL — ABNORMAL HIGH (ref 65–99)
Potassium: 4.8 mmol/L (ref 3.5–5.3)
SODIUM: 139 mmol/L (ref 135–146)
TOTAL PROTEIN: 6.7 g/dL (ref 6.1–8.1)

## 2016-09-06 LAB — URINE CYTOLOGY ANCILLARY ONLY
Chlamydia: NEGATIVE
NEISSERIA GONORRHEA: POSITIVE — AB

## 2016-09-06 LAB — HIV ANTIBODY (ROUTINE TESTING W REFLEX): HIV 1&2 Ab, 4th Generation: NONREACTIVE

## 2016-09-06 LAB — RPR

## 2016-09-06 NOTE — Telephone Encounter (Signed)
Pt called stating that he was told he'd have and Rx called in pending the results of his lab test. I informed Pt that his labs have not all returned yet and he stated he'd called the office again tomorrow. I asked the Pt to allow his lab results to come back first and informed him that we'd call him back.

## 2016-09-07 ENCOUNTER — Other Ambulatory Visit: Payer: Self-pay | Admitting: Infectious Disease

## 2016-09-07 DIAGNOSIS — Z7251 High risk heterosexual behavior: Secondary | ICD-10-CM

## 2016-09-08 ENCOUNTER — Telehealth: Payer: Self-pay | Admitting: *Deleted

## 2016-09-08 ENCOUNTER — Ambulatory Visit (INDEPENDENT_AMBULATORY_CARE_PROVIDER_SITE_OTHER): Payer: BLUE CROSS/BLUE SHIELD | Admitting: *Deleted

## 2016-09-08 DIAGNOSIS — A549 Gonococcal infection, unspecified: Secondary | ICD-10-CM | POA: Diagnosis not present

## 2016-09-08 MED ORDER — CEFTRIAXONE SODIUM 250 MG IJ SOLR: 250.0000 mg | Freq: Once | INTRAMUSCULAR | 0 refills | Status: DC

## 2016-09-08 MED ORDER — AZITHROMYCIN 1 G PO PACK
1.0000 g | PACK | Freq: Once | ORAL | Status: AC
  Administered 2016-09-08: 1 g via ORAL

## 2016-09-08 MED ORDER — CEFTRIAXONE SODIUM 250 MG IJ SOLR
250.0000 mg | Freq: Once | INTRAMUSCULAR | Status: AC
  Administered 2016-09-08: 250 mg via INTRAMUSCULAR

## 2016-09-08 NOTE — Telephone Encounter (Signed)
-----   Message from Truman Hayward, MD sent at 09/07/2016 11:03 PM EDT ----- Pt needs Ceftriaxone 240mg  IM and 1 gram of azithromycin. Partners need to be tested and treated

## 2016-09-08 NOTE — Progress Notes (Signed)
STI treatment per Dr Tommy Medal.  Patient given condoms, lubricant and dental dams.  Instructed that the treatment takes time to become fully cured.  Advised to always use protective for each sexual encounter.

## 2016-09-08 NOTE — Telephone Encounter (Signed)
STI treatment per Dr. Tommy Medal.  Patient scheduled for this afternoon, 09/08/16.

## 2016-09-08 NOTE — Telephone Encounter (Signed)
Patient came in today, 09/08/16, and received ordered treatment.  Given condoms, lubricant and dental dams.

## 2016-09-08 NOTE — Telephone Encounter (Signed)
Very good thx 

## 2016-09-08 NOTE — Patient Instructions (Signed)
Patient given condoms, lubricant and dental dams.  Instructed that the treatment takes time to become fully cured.  Advised to always use protective for each sexual encounter.

## 2016-09-09 NOTE — Telephone Encounter (Signed)
Patient received treatment 09/08/16.

## 2016-09-12 ENCOUNTER — Other Ambulatory Visit: Payer: BLUE CROSS/BLUE SHIELD

## 2016-09-12 ENCOUNTER — Telehealth: Payer: Self-pay | Admitting: *Deleted

## 2016-09-12 ENCOUNTER — Other Ambulatory Visit (HOSPITAL_COMMUNITY)
Admission: RE | Admit: 2016-09-12 | Discharge: 2016-09-12 | Disposition: A | Discharge status: Home / Self Care | Payer: BLUE CROSS/BLUE SHIELD | Source: Ambulatory Visit | Attending: Infectious Disease | Admitting: Infectious Disease

## 2016-09-12 DIAGNOSIS — A599 Trichomoniasis, unspecified: Secondary | ICD-10-CM

## 2016-09-12 DIAGNOSIS — R360 Urethral discharge without blood: Secondary | ICD-10-CM

## 2016-09-12 DIAGNOSIS — Z029 Encounter for administrative examinations, unspecified: Secondary | ICD-10-CM | POA: Insufficient documentation

## 2016-09-12 DIAGNOSIS — R369 Urethral discharge, unspecified: Principal | ICD-10-CM

## 2016-09-12 MED ORDER — METRONIDAZOLE 500 MG PO TABS: 2000.0000 mg | ORAL_TABLET | Freq: Once | ORAL | 0 refills | Status: AC

## 2016-09-12 NOTE — Telephone Encounter (Signed)
Having increased penial discharge today, asking about treatment.  RN spoke with Dr. Tommy Medal about the patient's symptoms.  Dr. Tommy Medal gave a verbal order for 2 grams Metronidazole orally for one dose, urine for trichimoniasis Patient to be instructed not to drink alcohol for the next 48 hours.

## 2016-09-12 NOTE — Telephone Encounter (Signed)
Thanks Langley Gauss and we will check for GC culture if he submits specimen and repeat GC and chlamydia test and trichomonas test

## 2016-09-14 LAB — URINE CULTURE: Organism ID, Bacteria: NO GROWTH

## 2016-09-15 LAB — URINE CYTOLOGY ANCILLARY ONLY
Chlamydia: NEGATIVE
Neisseria Gonorrhea: NEGATIVE
TRICH (WINDOWPATH): NEGATIVE

## 2016-09-19 ENCOUNTER — Ambulatory Visit: Payer: BLUE CROSS/BLUE SHIELD | Admitting: Infectious Disease

## 2016-11-02 ENCOUNTER — Other Ambulatory Visit (HOSPITAL_COMMUNITY)
Admission: RE | Admit: 2016-11-02 | Discharge: 2016-11-02 | Disposition: A | Discharge status: Home / Self Care | Payer: BLUE CROSS/BLUE SHIELD | Source: Ambulatory Visit | Attending: Infectious Disease | Admitting: Infectious Disease

## 2016-11-02 ENCOUNTER — Ambulatory Visit (INDEPENDENT_AMBULATORY_CARE_PROVIDER_SITE_OTHER): Payer: BLUE CROSS/BLUE SHIELD | Admitting: Infectious Disease

## 2016-11-02 ENCOUNTER — Encounter: Payer: Self-pay | Admitting: Infectious Disease

## 2016-11-02 VITALS — BP 132/82 | HR 82 | Temp 98.9°F | Ht 73.0 in | Wt 163.0 lb

## 2016-11-02 DIAGNOSIS — Z113 Encounter for screening for infections with a predominantly sexual mode of transmission: Secondary | ICD-10-CM | POA: Diagnosis not present

## 2016-11-02 DIAGNOSIS — Z206 Contact with and (suspected) exposure to human immunodeficiency virus [HIV]: Secondary | ICD-10-CM

## 2016-11-02 DIAGNOSIS — A549 Gonococcal infection, unspecified: Secondary | ICD-10-CM | POA: Diagnosis not present

## 2016-11-02 DIAGNOSIS — Z7251 High risk heterosexual behavior: Secondary | ICD-10-CM

## 2016-11-02 HISTORY — DX: Gonococcal infection, unspecified: A54.9

## 2016-11-02 NOTE — Progress Notes (Signed)
Chief complaint: here for follow-up for Pre-Exposure Prophylaxis   Subjective:    Patient ID: Richard Hahn, male    DOB: 1960/11/23, 56 y.o.   MRN: [id number]  HPI  56 year old man who has gone through separation from his wife but who also has sex with men.   He has  largely been with one male partner who is HIV + on meds and with undetectable viral load.    His partner is under care at [facility] and now here. Partner has had HIV for nearly 30 years    Mr Schnitzer  is tolerating his Truvada without problems.   Since I last saw him he has had some other new sexual partners he also tested positive for gonorrhea receive treatment in the clinic. He was with 1 sexual partner recently who disclosed to him that he had genital herpes. He had several questions about how to herpes could be contracted and how it could be diagnosed.  He's been highly compliant with his antiretroviral therapy. He is at no symptoms consistent with acute HIV syndrome.   Past Medical History:  Diagnosis Date  . ADHD (attention deficit hyperactivity disorder)   . Counseling related to patient's sexual behavior and orientation 06/27/2016  . Exposure to HIV 06/27/2016  . Headache   . High risk sexual behavior 04/06/2015  . Hyperlipidemia   . Hypothyroidism   . Low testosterone   . Screen for STD (sexually transmitted disease) 04/06/2015  . Vision abnormalities     Past Surgical History:  Procedure Laterality Date  . NASAL SINUS SURGERY    . TONSILLECTOMY AND ADENOIDECTOMY      Family History  Problem Relation Age of Onset  . High Cholesterol Mother   . High Cholesterol Father   . Skin cancer Father       Social History   Social History  . Marital status: Unknown    Spouse name: N/A  . Number of children: N/A  . Years of education: N/A   Social History Main Topics  . Smoking status: Never Smoker  . Smokeless tobacco: Never Used  . Alcohol use No  . Drug use: No  . Sexual activity: Not on file    Other Topics Concern  . Not on file   Social History Narrative  . No narrative on file    Allergies  Allergen Reactions  . Penicillins Hives     Current Outpatient Prescriptions:  .  ALPRAZolam (XANAX) 0.5 MG tablet, Take 0.5 mg by mouth at bedtime., Disp: , Rfl: 1 .  ARMOUR THYROID 15 MG tablet, TK 1 T PO D WITH 30 MG THYROID T, Disp: , Rfl: 1 .  ARMOUR THYROID 30 MG tablet, TK 1 T PO UTD WITH 15 MG T, Disp: , Rfl: 1 .  fluticasone (FLONASE) 50 MCG/ACT nasal spray, INSTILL 2 SPRAYS IN EACH NOSTRIL DAILY, Disp: , Rfl: 5 .  LIVALO 1 MG TABS, TK 1 T PO 1 TO 2 TIMES A WEEK, Disp: , Rfl: 2 .  Multiple Vitamin (MULTIVITAMIN) tablet, Take 1 tablet by mouth daily., Disp: , Rfl:  .  omega-3 acid ethyl esters (LOVAZA) 1 G capsule, Take 2 capsules by mouth 2 (two) times daily., Disp: , Rfl: 3 .  SUMAtriptan (IMITREX) 100 MG tablet, TK 1 T PO PRF HA. MAY REPEAT DOSE ONCE IN 2 HOURS IF NEEDED. MAX OF 2 TS PER DAY., Disp: , Rfl: 0 .  testosterone cypionate (DEPOTESTOTERONE CYPIONATE) 200 MG/ML injection, INJECT 0.8MLS  INTO LATERAL THIGH ONCE WEEKLY, Disp: , Rfl: 1 .  TRUVADA 200-300 MG tablet, TAKE 1 TABLET BY MOUTH EVERY DAY, Disp: 30 tablet, Rfl: 1 .  VIAGRA 100 MG tablet, TK 1 T PO  D PRN, Disp: , Rfl: 0 .  VYVANSE 30 MG capsule, , Disp: , Rfl: 0    Review of Systems  Constitutional: Negative for activity change, appetite change, chills, diaphoresis, fatigue, fever and unexpected weight change.  HENT: Negative for congestion, rhinorrhea, sinus pressure, sneezing, sore throat and trouble swallowing.   Eyes: Negative for photophobia and visual disturbance.  Respiratory: Negative for apnea, cough, chest tightness, shortness of breath, wheezing and stridor.   Cardiovascular: Negative for chest pain, palpitations and leg swelling.  Gastrointestinal: Negative for abdominal distention, abdominal pain, anal bleeding, blood in stool, constipation, diarrhea, nausea and vomiting.  Genitourinary:  Negative for difficulty urinating, dysuria, flank pain and hematuria.  Musculoskeletal: Negative for arthralgias, back pain, gait problem and joint swelling.  Skin: Negative for color change, pallor, rash and wound.  Neurological: Negative for dizziness, tremors, weakness and light-headedness.  Hematological: Negative for adenopathy. Does not bruise/bleed easily.  Psychiatric/Behavioral: Negative for agitation, behavioral problems, confusion, decreased concentration, dysphoric mood and sleep disturbance.       Objective:   Physical Exam  Constitutional: He is oriented to person, place, and time. He appears well-developed and well-nourished.  HENT:  Head: Normocephalic and atraumatic.  Eyes: Conjunctivae and EOM are normal.  Neck: Normal range of motion. Neck supple.  Cardiovascular: Normal rate and regular rhythm.   Pulmonary/Chest: Effort normal. No respiratory distress. He has no wheezes.  Abdominal: Soft. He exhibits no distension.  Musculoskeletal: Normal range of motion. He exhibits no edema or tenderness.  Neurological: He is alert and oriented to person, place, and time.  Skin: Skin is warm and dry. No rash noted. No erythema. No pallor.  Psychiatric: His behavior is normal. Judgment and thought content normal.          Assessment & Plan:   Encounter for screening for infections with L[place] sexual mode of transmission, exposure to HIV, other problems related related to high risk sexual intercourse, Screening for STD, need for PrEP  --Recheck OP, rectal and urine for GC and chlamydia, serum for RPR, hepatitis serologies, HIV by 4th generation test, BMP  --if he is HIV negative and other labs show safe renew PrEP with Truvada x 3 months and RTC  HSV 2 exposure: He do not see much point in checking for herpes serologies as this patient certainly could artery have herpes infection from prior sexual or course and symptoms be a person he does not manifest physical symptoms of  it. Certainly if he is acquired new herpes infection from the sexual partner this is also not relevant unless he is developing symptoms.   I spent greater than 25  minutes with the patient including greater than 50% of time in face to face counsel of the patient re HIV PrEP, STI counseling,  also with regards to HSV-2 and in coordination of his care.

## 2016-11-03 ENCOUNTER — Telehealth: Payer: Self-pay | Admitting: Infectious Disease

## 2016-11-03 DIAGNOSIS — Z7251 High risk heterosexual behavior: Secondary | ICD-10-CM

## 2016-11-03 LAB — BASIC METABOLIC PANEL WITH GFR
BUN: 19 mg/dL (ref 7–25)
CHLORIDE: 101 mmol/L (ref 98–110)
CO2: 27 mmol/L (ref 20–32)
CREATININE: 1.11 mg/dL (ref 0.70–1.33)
Calcium: 9.5 mg/dL (ref 8.6–10.3)
GFR, Est African American: 85 mL/min (ref >=60)
GFR, Est Non African American: 73 mL/min (ref >=60)
Glucose, Bld: 95 mg/dL (ref 65–99)
Potassium: 4 mmol/L (ref 3.5–5.3)
Sodium: 137 mmol/L (ref 135–146)

## 2016-11-03 LAB — RPR

## 2016-11-03 LAB — HIV ANTIBODY (ROUTINE TESTING W REFLEX): HIV 1&2 Ab, 4th Generation: NONREACTIVE

## 2016-11-03 MED ORDER — EMTRICITABINE-TENOFOVIR DF 200-300 MG PO TABS: 1.0000 | ORAL_TABLET | Freq: Every day | ORAL | 2 refills | Status: DC

## 2016-11-03 NOTE — Telephone Encounter (Signed)
HIV negative. Signed script but not sure where to send can someone find out?

## 2016-11-04 ENCOUNTER — Other Ambulatory Visit: Payer: Self-pay | Admitting: *Deleted

## 2016-11-04 DIAGNOSIS — Z7251 High risk heterosexual behavior: Secondary | ICD-10-CM

## 2016-11-04 LAB — CYTOLOGY, (ORAL, ANAL, URETHRAL) ANCILLARY ONLY
CHLAMYDIA, DNA PROBE: NEGATIVE
Chlamydia: NEGATIVE
Neisseria Gonorrhea: NEGATIVE
Neisseria Gonorrhea: NEGATIVE

## 2016-11-04 LAB — URINE CYTOLOGY ANCILLARY ONLY
Chlamydia: NEGATIVE
Neisseria Gonorrhea: NEGATIVE

## 2016-11-04 MED ORDER — EMTRICITABINE-TENOFOVIR DF 200-300 MG PO TABS: 1.0000 | ORAL_TABLET | Freq: Every day | ORAL | 2 refills | Status: DC

## 2016-11-04 NOTE — Telephone Encounter (Signed)
Left generic message asking for pharmacy preference. Landis Gandy, RN

## 2016-11-06 NOTE — Telephone Encounter (Signed)
perfect

## 2016-11-15 ENCOUNTER — Other Ambulatory Visit: Payer: Self-pay | Admitting: Infectious Disease

## 2016-11-15 DIAGNOSIS — Z7251 High risk heterosexual behavior: Secondary | ICD-10-CM

## 2017-01-02 ENCOUNTER — Other Ambulatory Visit: Payer: Self-pay | Admitting: Infectious Disease

## 2017-01-02 ENCOUNTER — Other Ambulatory Visit: Payer: BLUE CROSS/BLUE SHIELD

## 2017-01-02 ENCOUNTER — Other Ambulatory Visit (HOSPITAL_COMMUNITY)
Admission: RE | Admit: 2017-01-02 | Discharge: 2017-01-02 | Disposition: A | Discharge status: Home / Self Care | Payer: BLUE CROSS/BLUE SHIELD | Source: Ambulatory Visit | Attending: Infectious Disease | Admitting: Infectious Disease

## 2017-01-02 DIAGNOSIS — Z113 Encounter for screening for infections with a predominantly sexual mode of transmission: Secondary | ICD-10-CM | POA: Insufficient documentation

## 2017-01-02 DIAGNOSIS — Z206 Contact with and (suspected) exposure to human immunodeficiency virus [HIV]: Secondary | ICD-10-CM | POA: Insufficient documentation

## 2017-01-02 NOTE — Progress Notes (Signed)
Pt here for PrEP orders

## 2017-01-03 LAB — BASIC METABOLIC PANEL WITH GFR
BUN: 14 mg/dL (ref 7–25)
CALCIUM: 9.2 mg/dL (ref 8.6–10.3)
CO2: 29 mmol/L (ref 20–32)
CREATININE: 1.18 mg/dL (ref 0.70–1.33)
Chloride: 101 mmol/L (ref 98–110)
GFR, EST AFRICAN AMERICAN: 78 mL/min/{1.73_m2} (ref >=60)
GFR, Est Non African American: 68 mL/min/{1.73_m2} (ref >=60)
Glucose, Bld: 113 mg/dL — ABNORMAL HIGH (ref 65–99)
Potassium: 4.4 mmol/L (ref 3.5–5.3)
Sodium: 136 mmol/L (ref 135–146)

## 2017-01-03 LAB — HIV ANTIBODY (ROUTINE TESTING W REFLEX): HIV 1&2 Ab, 4th Generation: NONREACTIVE

## 2017-01-03 LAB — URINE CYTOLOGY ANCILLARY ONLY
CHLAMYDIA, DNA PROBE: NEGATIVE
NEISSERIA GONORRHEA: NEGATIVE

## 2017-01-03 LAB — CYTOLOGY, (ORAL, ANAL, URETHRAL) ANCILLARY ONLY
CHLAMYDIA, DNA PROBE: NEGATIVE
Chlamydia: NEGATIVE
NEISSERIA GONORRHEA: NEGATIVE
Neisseria Gonorrhea: NEGATIVE

## 2017-01-03 LAB — RPR: RPR Ser Ql: NONREACTIVE

## 2017-01-04 ENCOUNTER — Telehealth: Payer: Self-pay | Admitting: Infectious Disease

## 2017-01-04 DIAGNOSIS — Z7252 High risk homosexual behavior: Secondary | ICD-10-CM

## 2017-01-04 MED ORDER — EMTRICITABINE-TENOFOVIR DF 200-300 MG PO TABS: 1.0000 | ORAL_TABLET | Freq: Every day | ORAL | 2 refills | Status: DC

## 2017-01-04 NOTE — Telephone Encounter (Signed)
HIV test negative. Truvada ordered x 3 months (he should also be having visit with me)

## 2017-02-06 ENCOUNTER — Ambulatory Visit: Payer: BLUE CROSS/BLUE SHIELD | Admitting: Infectious Disease

## 2017-03-01 ENCOUNTER — Other Ambulatory Visit: Payer: Self-pay | Admitting: Infectious Disease

## 2017-03-01 DIAGNOSIS — Z7251 High risk heterosexual behavior: Secondary | ICD-10-CM

## 2017-03-31 ENCOUNTER — Other Ambulatory Visit: Payer: Self-pay

## 2017-03-31 DIAGNOSIS — Z7251 High risk heterosexual behavior: Secondary | ICD-10-CM

## 2017-03-31 MED ORDER — EMTRICITABINE-TENOFOVIR DF 200-300 MG PO TABS: 1.0000 | ORAL_TABLET | Freq: Every day | ORAL | 1 refills | Status: DC

## 2017-03-31 NOTE — Telephone Encounter (Signed)
Pharmacy calling for Truvada refill.   Truvada sent to pharmacy.

## 2017-05-15 ENCOUNTER — Other Ambulatory Visit: Payer: Self-pay | Admitting: Infectious Disease

## 2017-05-15 DIAGNOSIS — Z7251 High risk heterosexual behavior: Secondary | ICD-10-CM

## 2018-01-16 ENCOUNTER — Telehealth: Payer: Self-pay | Admitting: *Deleted

## 2018-01-16 NOTE — Telephone Encounter (Signed)
Called Richard Hahn per Dr. Felecia Shelling request to work him in for appt for neck pain. Scheduled appt for 01/17/18 at 930am. Advised him to check in 9am. Richard Hahn verbalized understanding.

## 2018-01-17 ENCOUNTER — Other Ambulatory Visit: Payer: Self-pay

## 2018-01-17 ENCOUNTER — Ambulatory Visit (INDEPENDENT_AMBULATORY_CARE_PROVIDER_SITE_OTHER): Payer: BLUE CROSS/BLUE SHIELD | Admitting: Neurology

## 2018-01-17 ENCOUNTER — Telehealth: Payer: Self-pay | Admitting: Neurology

## 2018-01-17 ENCOUNTER — Encounter: Payer: Self-pay | Admitting: Neurology

## 2018-01-17 VITALS — BP 122/70 | HR 74 | Ht 73.0 in | Wt 169.0 lb

## 2018-01-17 DIAGNOSIS — M79603 Pain in arm, unspecified: Secondary | ICD-10-CM | POA: Insufficient documentation

## 2018-01-17 DIAGNOSIS — M79602 Pain in left arm: Secondary | ICD-10-CM

## 2018-01-17 DIAGNOSIS — M542 Cervicalgia: Secondary | ICD-10-CM | POA: Diagnosis not present

## 2018-01-17 DIAGNOSIS — M5412 Radiculopathy, cervical region: Secondary | ICD-10-CM

## 2018-01-17 MED ORDER — ETODOLAC 400 MG PO TABS: 400.0000 mg | ORAL_TABLET | Freq: Two times a day (BID) | ORAL | 5 refills | Status: DC

## 2018-01-17 NOTE — Progress Notes (Signed)
[facility]  PATIENT: Richard Hahn DOB: 1960-10-07  REFERRING DOCTOR OR PCP:  Sandi Mariscal SOURCE: patient  _________________________________   HISTORICAL  CHIEF COMPLAINT:  Chief Complaint  Patient presents with  . Follow-up    RM 13, alone.  Last seen 08/25/2014. Here to f/u on neck pain. Has tried "cupping" for treatment but states this made sx worse.     HISTORY OF PRESENT ILLNESS:  Richard Hahn is a 57 yo man with neck pain.    Update 01/17/2018: He had the onset of neck pain and left arm pain a few weeks ago while travelling in [place].  Neck pain is bilateral though the arm pain is worse on the left.  Pain entered the left fingertips when more intense.    He was referred to a physician who did acupuncture, dry needling and cupping/bloodletting.   Later that day and the next day, he felt dizzy/lightheaded but pain was better for a few days.    He had a second smaller procedure a few days later an did better.   He still has some neck pain that is mild to moderate most of the time and severe intermittently.    He has some intermittent arm tingling but not pain.           I personally reviewed the MRI of the cervical spine dated 09/18/2014.   At C4-C5, he has right facet hypertrophy.  There is mild right foraminal narrowing.  At C5-C6 he has bilateral foraminal narrowing due to uncovertebral spurring and disc bulging.  At C6-C7, there is left greater than right uncovertebral spurring and mild left foraminal narrowing but no nerve root compression.  From 08/25/2014 He is a 57 year old man in good general health who had the sudden onset left leg pain and numbness yesterday. He was standing up when he took a step forward and the left leg collapsed and he fell he felt severe numbness going down the left leg all the way to the foot this was associated with a dull ache but not significant pain. Numbness has persisted but is much less intense today than it was yesterday. He  currently denies any weakness and just has a minimal sensation of numbness in the left leg but does not note any actual numbness when he touches the leg. He has not noted any change in bladder or bowel.  He also notes mild neck pain with some neck spasticity. The symptoms have been more constant recently it had not changed yesterday. He occasionally notes a grinding sound in the upper back when he moves.  He has a history of scoliosis and occasionally get some back discomfort. He has never had any spinal procedures  REVIEW OF SYSTEMS: Constitutional: No fevers, chills, sweats, or change in appetite Eyes: No visual changes, double vision, eye pain Ear, nose and throat: No hearing loss, ear pain, nasal congestion, sore throat Cardiovascular: No chest pain, palpitations Respiratory: No shortness of breath at rest or with exertion.   No wheezes GastrointestinaI: No nausea, vomiting, diarrhea, abdominal pain, fecal incontinence Genitourinary: No dysuria, urinary retention or frequency.  No nocturia. Musculoskeletal:Pain as above Integumentary: No rash, pruritus, skin lesions Neurological: as above Psychiatric: No depression at this time.  No anxiety Endocrine: No palpitations, diaphoresis, change in appetite, change in weigh or increased thirst Hematologic/Lymphatic: No anemia, purpura, petechiae. Allergic/Immunologic: No itchy/runny eyes, nasal congestion, recent allergic reactions, rashes  ALLERGIES: Allergies  Allergen Reactions  . Penicillins Hives    HOME MEDICATIONS:  Current Outpatient Medications:  .  ALPRAZolam (XANAX) 0.5 MG tablet, Take 0.5 mg by mouth as needed. , Disp: , Rfl: 1 .  LIVALO 1 MG TABS, TK 1 T PO 1 TO 2 TIMES A WEEK, Disp: , Rfl: 2 .  Multiple Vitamin (MULTIVITAMIN) tablet, Take 1 tablet by mouth daily., Disp: , Rfl:  .  omega-3 acid ethyl esters (LOVAZA) 1 G capsule, Take 2 capsules by mouth 2 (two) times daily., Disp: , Rfl: 3 .  SUMAtriptan (IMITREX) 100  MG tablet, TK 1 T PO PRF HA. MAY REPEAT DOSE ONCE IN 2 HOURS IF NEEDED. MAX OF 2 TS PER DAY., Disp: , Rfl: 0 .  testosterone cypionate (DEPOTESTOTERONE CYPIONATE) 200 MG/ML injection, INJECT 0.8MLS INTO LATERAL THIGH ONCE WEEKLY, Disp: , Rfl: 1 .  THYROID PO, Take 1 Dose by mouth daily. Nature Throid- 3/4 grain, Disp: , Rfl:  .  VIAGRA 100 MG tablet, TK 1 T PO  D PRN, Disp: , Rfl: 0 .  VYVANSE 30 MG capsule, , Disp: , Rfl: 0 .  etodolac (LODINE) 400 MG tablet, Take 1 tablet (400 mg total) by mouth 2 (two) times daily., Disp: 60 tablet, Rfl: 5  PAST MEDICAL HISTORY: Past Medical History:  Diagnosis Date  . ADHD (attention deficit hyperactivity disorder)   . Counseling related to patient's sexual behavior and orientation 06/27/2016  . Exposure to HIV 06/27/2016  . Gonorrhea 11/02/2016  . Headache   . High risk sexual behavior 04/06/2015  . Hyperlipidemia   . Hypothyroidism   . Low testosterone   . Screen for STD (sexually transmitted disease) 04/06/2015  . Vision abnormalities     PAST SURGICAL HISTORY: Past Surgical History:  Procedure Laterality Date  . NASAL SINUS SURGERY    . TONSILLECTOMY AND ADENOIDECTOMY      FAMILY HISTORY: Family History  Problem Relation Age of Onset  . High Cholesterol Mother   . High Cholesterol Father   . Skin cancer Father     SOCIAL HISTORY:  Social History   Socioeconomic History  . Marital status: Unknown    Spouse name: Not on file  . Number of children: Not on file  . Years of education: Not on file  . Highest education level: Not on file  Occupational History  . Not on file  Social Needs  . Financial resource strain: Not on file  . Food insecurity:    Worry: Not on file    Inability: Not on file  . Transportation needs:    Medical: Not on file    Non-medical: Not on file  Tobacco Use  . Smoking status: Never Smoker  . Smokeless tobacco: Never Used  Substance and Sexual Activity  . Alcohol use: No    Alcohol/week: 0.0  standard drinks  . Drug use: No  . Sexual activity: Not on file  Lifestyle  . Physical activity:    Days per week: Not on file    Minutes per session: Not on file  . Stress: Not on file  Relationships  . Social connections:    Talks on phone: Not on file    Gets together: Not on file    Attends religious service: Not on file    Active member of club or organization: Not on file    Attends meetings of clubs or organizations: Not on file    Relationship status: Not on file  . Intimate partner violence:    Fear of current or ex partner: Not on file  Emotionally abused: Not on file    Physically abused: Not on file    Forced sexual activity: Not on file  Other Topics Concern  . Not on file  Social History Narrative   Lives   Caffeine use:      PHYSICAL EXAM  Vitals:   01/17/18 0932  BP: 122/70  Pulse: 74  SpO2: 98%  Weight: 169 lb (76.7 kg)  Height: 6\' 1"  (1.854 m)    Body mass index is 22.3 kg/m.   General: The patient is well-developed and well-nourished and in no acute distress  Neck: Bilateral tenderness over the paraspinal muscles of the neck.  Range of motion is slightly reduced in the neck..   Musculoskeletal:   He has scoliosis upper mid lumbar spine that is convex to the left and there is more gentle scoliosis in the thoracic spine convex to the right. The right hip is elevated, compared to the left.  Neurologic Exam  Mental status: The patient is alert and oriented x 3 at the time of the examination. The patient has apparent normal recent and remote memory, with an apparently normal attention span and concentration ability.   Speech is normal.  Cranial nerves: Extraocular movements are full.  Facial strength is normal.  No dysarthria is noted.   No obvious hearing deficits are noted.  Motor:  Muscle bulk is normal.   Tone is normal. Strength is  5 / 5 in all 4 extremities.   Sensory: Sensory testing is intact to  touch and vibration sensation in the  arms  Gait and station: Station is normal.   Gait is normal. Tandem gait is normal.     Reflexes: Deep tendon reflexes show some asymmetry, reduced in the left biceps.    DIAGNOSTIC DATA (LABS, IMAGING, TESTING) - I reviewed patient records, labs, notes, testing and imaging myself where available.     ASSESSMENT AND PLAN  Cervical radiculopathy - Plan: MR CERVICAL SPINE WO CONTRAST  Neck pain  Pain of left upper extremity  1.   Trigger point injection of the leftt C5-C6 and C6-C7 and right C4-C5 and C5-C6 paraspinal muscles with 80 mg Depo-Medrol in [place].  He tolerated the injections well.  There were no complications. 2.    Etodolac 400 mg p.o. twice daily. 3.    MRI of the cervical spine to better evaluate the cervical radiculopathy and help determine whether or not referral to surgery is recommended. 4.    He will return to see me as needed but call if any new or worsening neurologic symptoms.  Karon Cotterill A. Felecia Shelling, MD, PhD 34/04/8766, 1:15 PM Certified in Neurology, Clinical Neurophysiology, Sleep Medicine, Pain Medicine and Neuroimaging  [facility] [address] [place], [place] [address] [phone]

## 2018-01-17 NOTE — Telephone Encounter (Signed)
MR Cervical spine wo contrast Dr. Cheree Ditto Auth: [id number] (exp. 01/17/18 to 02/15/18). Patient is scheduled at [facility] for 01/24/18.

## 2018-01-24 ENCOUNTER — Other Ambulatory Visit: Payer: BLUE CROSS/BLUE SHIELD

## 2018-01-25 ENCOUNTER — Other Ambulatory Visit: Payer: Self-pay | Admitting: *Deleted

## 2018-01-25 DIAGNOSIS — M542 Cervicalgia: Secondary | ICD-10-CM

## 2018-01-25 NOTE — Telephone Encounter (Signed)
On 01/24/18 pt did come to his appt to [place] but did not have the MRI cervical done because he wanted MRI Brain to be added. I did get the Brain approve [employer] Auth: [id number] (exp. 01/25/18 to 02/23/18).  I did lvm for pt to call me back to get both of these exams schedule together.

## 2018-01-25 NOTE — Telephone Encounter (Signed)
Patient returned my call he is scheduled for 01/31/18 at [facility].

## 2018-01-31 ENCOUNTER — Ambulatory Visit (INDEPENDENT_AMBULATORY_CARE_PROVIDER_SITE_OTHER): Payer: BLUE CROSS/BLUE SHIELD

## 2018-01-31 DIAGNOSIS — M5412 Radiculopathy, cervical region: Secondary | ICD-10-CM

## 2018-01-31 DIAGNOSIS — M542 Cervicalgia: Secondary | ICD-10-CM | POA: Diagnosis not present

## 2018-02-02 ENCOUNTER — Other Ambulatory Visit: Payer: Self-pay | Admitting: *Deleted

## 2018-02-02 ENCOUNTER — Other Ambulatory Visit (INDEPENDENT_AMBULATORY_CARE_PROVIDER_SITE_OTHER): Payer: Self-pay

## 2018-02-02 ENCOUNTER — Other Ambulatory Visit: Payer: Self-pay | Admitting: Neurology

## 2018-02-02 ENCOUNTER — Telehealth: Payer: Self-pay | Admitting: Neurology

## 2018-02-02 DIAGNOSIS — M542 Cervicalgia: Secondary | ICD-10-CM

## 2018-02-02 DIAGNOSIS — M869 Osteomyelitis, unspecified: Secondary | ICD-10-CM

## 2018-02-02 DIAGNOSIS — Z0289 Encounter for other administrative examinations: Secondary | ICD-10-CM

## 2018-02-02 DIAGNOSIS — M898X9 Other specified disorders of bone, unspecified site: Secondary | ICD-10-CM | POA: Insufficient documentation

## 2018-02-02 NOTE — Telephone Encounter (Signed)
I spoke to Richard Hahn last night about the results of the MRI.  There are changes involving the C2-C3 facet joint that appear inflammatory or infectious.  It is essential that we evaluate this further due to the possibility of osteomyelitis that would require strong antibiotic treatment.  He needs to have the following done:  1.   Blood work for ESR, CRP, CBC with differential. 2.   MRI of the cervical spine with contrast to better evaluate the region and around the C2-C3 facet joint.    If there is enhancement, infection is much more likely that he may need referral to neurosurgery for aspiration to get a definitive diagnosis to guide treatment.  Richard Hahn was in agreement with the plan and we will try to get the MRI performed as soon as possible to get him started on therapy if this is infection.  Simply does not

## 2018-02-02 NOTE — Telephone Encounter (Signed)
Called Richard Hahn and scheduled for 11/23 @ 3:00. [employer] authorization is [id number] valid through (03/03/18). DW

## 2018-02-02 NOTE — Telephone Encounter (Signed)
MRI Cervical  w was denied. Called [employer] and spoke with nurse reviewer, I explained the results of previous imaging per Dr. Garth Bigness phone note. She stated that additional clinicals play no role in approval, it is their guideline that it requires a p2p after a recent imaging has just been completed. It is still denied a p2p will need to be done if this is to be completed today, if not we can wait to hear back from the additional clinicals sent to the reviewing doctor their number is [phone]. I have given the above information to the nurse reviewer and faxed over to the doctor working the case. There will be a 24-48 hour turn around. Please advise on how to proceed.

## 2018-02-02 NOTE — Telephone Encounter (Signed)
I called the patient to make him aware of apt but he did not answer so I left a VM relaying the information for the apt. (ok per DPR).

## 2018-02-02 NOTE — Telephone Encounter (Signed)
Peer to Peer performed.    Approved between today and 03/03/18.   Number provided to [employer]

## 2018-02-03 ENCOUNTER — Ambulatory Visit (HOSPITAL_COMMUNITY)
Admission: RE | Admit: 2018-02-03 | Discharge: 2018-02-03 | Disposition: A | Discharge status: Home / Self Care | Payer: BLUE CROSS/BLUE SHIELD | Source: Ambulatory Visit | Attending: Neurology | Admitting: Neurology

## 2018-02-03 DIAGNOSIS — S161XXA Strain of muscle, fascia and tendon at neck level, initial encounter: Secondary | ICD-10-CM | POA: Diagnosis not present

## 2018-02-03 DIAGNOSIS — M542 Cervicalgia: Secondary | ICD-10-CM | POA: Insufficient documentation

## 2018-02-03 DIAGNOSIS — M869 Osteomyelitis, unspecified: Secondary | ICD-10-CM | POA: Diagnosis not present

## 2018-02-03 DIAGNOSIS — M47812 Spondylosis without myelopathy or radiculopathy, cervical region: Secondary | ICD-10-CM | POA: Insufficient documentation

## 2018-02-03 DIAGNOSIS — X58XXXA Exposure to other specified factors, initial encounter: Secondary | ICD-10-CM | POA: Insufficient documentation

## 2018-02-03 LAB — CBC WITH DIFFERENTIAL/PLATELET
BASOS ABS: 0.1 10*3/uL (ref 0.0–0.2)
BASOS: 2 %
EOS (ABSOLUTE): 0.1 10*3/uL (ref 0.0–0.4)
Eos: 3 %
HEMATOCRIT: 53 % — AB (ref 37.5–51.0)
HEMOGLOBIN: 17.5 g/dL (ref 13.0–17.7)
IMMATURE GRANS (ABS): 0 10*3/uL (ref 0.0–0.1)
IMMATURE GRANULOCYTES: 0 %
LYMPHS: 30 %
Lymphocytes Absolute: 1.4 10*3/uL (ref 0.7–3.1)
MCH: 27.9 pg (ref 26.6–33.0)
MCHC: 33 g/dL (ref 31.5–35.7)
MCV: 85 fL (ref 79–97)
MONOCYTES: 8 %
Monocytes Absolute: 0.4 10*3/uL (ref 0.1–0.9)
NEUTROS ABS: 2.6 10*3/uL (ref 1.4–7.0)
NEUTROS PCT: 57 %
Platelets: 168 10*3/uL (ref 150–450)
RBC: 6.27 x10E6/uL — ABNORMAL HIGH (ref 4.14–5.80)
RDW: 14.8 % (ref 12.3–15.4)
WBC: 4.5 10*3/uL (ref 3.4–10.8)

## 2018-02-03 LAB — C-REACTIVE PROTEIN: CRP: <1 mg/L (ref 0–10)

## 2018-02-03 LAB — SEDIMENTATION RATE: Sed Rate: 2 mm/hr (ref 0–30)

## 2018-02-03 MED ORDER — GADOBUTROL 1 MMOL/ML IV SOLN
7.5000 mL | Freq: Once | INTRAVENOUS | Status: AC | PRN
  Administered 2018-02-03: 7 mL via INTRAVENOUS

## 2018-02-03 NOTE — Telephone Encounter (Signed)
Called pt with MRI c spine with contrast and blood test results: per my review, results are supportive of a rather severe neck arthritis flare rather than infection of even osteomyelitis. Therefore, I suggested with continue on current course, which is antiinflam medication. Blood tests not indicative of any systemic inflammation or infection. Patient was reassured and advised, that I will route message to Dr. Felecia Shelling and his nurse for review and to add any additional recommendations if needed on Monday. Pt was in agreement and reported that the etodolac - he has been taking 1 bid, some days only qd, - has been somewhat helpful, no SEs reported.

## 2018-02-04 ENCOUNTER — Telehealth: Payer: Self-pay | Admitting: Neurology

## 2018-02-04 NOTE — Telephone Encounter (Signed)
I spoke to Richard Hahn about the lab and MRI results.  The laboratory tests are normal, making osteomyelitis less likely.  Additionally, the contrasted MRI of the cervical spine is more consistent with severe inflammation in and around the C2-C3 t right facet joint rather than osteomyelitis.  He is actually feeling better with the anti-inflammatory.  If he continues to improve no further work-up is necessary.  He worsens we may need to consider repeat imaging to determine if there has been any progression.  There is no known etiology of a severe sprain or other musculoskeletal event over the past couple months.  He is active and does a lot of traveling.

## 2018-07-30 ENCOUNTER — Other Ambulatory Visit: Payer: Self-pay | Admitting: Neurology

## 2018-07-30 ENCOUNTER — Telehealth: Payer: Self-pay | Admitting: Neurology

## 2018-07-30 MED ORDER — ETODOLAC 400 MG PO TABS: 400.0000 mg | ORAL_TABLET | Freq: Two times a day (BID) | ORAL | 5 refills | Status: AC

## 2018-07-30 NOTE — Telephone Encounter (Signed)
This established Patient is treated by you for neck pain . He requested medication refill on the weekend, he made a point about being a friend of yours and his pharmacy having called the medication in last week. I refilled.   CD

## 2018-12-26 MED ORDER — REPATHA SURECLICK 140 MG/ML SUBCUTANEOUS PEN INJECTOR: mL | 3 refills | 0 days

## 2018-12-26 NOTE — Unmapped (Signed)
[facility] Specialty Medication Referral: No PA required    Medication (Brand/Generic): Repatha    Initial Benefits Investigation Claim completed with resulted information below:  No PA required  Patient ABLE to fill at [facility]  Insurance Company:  [employer]  Anticipated Copay: $0    As Co-pay is under $25 defined limit, per policy there will be no further investigation of need for financial assistance at this time unless patient requests. This referral has been communicated to the provider and handed off to the [facility] team for further processing and filling of prescribed medication.   ______________________________________________________________________  Please utilize this referral for viewing purposes as it will serve as the central location for all relevant documentation and updates.

## 2018-12-27 NOTE — Unmapped (Signed)
[facility]   Patient Onboarding/Medication Counseling    JeffJeff Scott is a 58 y.o. male with hyperlipidemia who I am counseling today on continuation of therapy.  I am speaking to the patient.    Verified patient's date of birth / HIPAA.    Specialty medication(s) to be sent: General Specialty: Repatha      Non-specialty medications/supplies to be sent: sharps kit         Repatha (evolocumab)    Medication & Administration     Dosage: Inject the contents of 1 pen (140mg ) under the skin every 2 weeks.    Administration: Administer under the skin of the abdomen, thigh or upper arm. Rotate sites with each injection.  ? Injection instructions - Autoinjector:  o Remove 1 Repatha autoinjector from the refrigerator and let stand at room temperature for at least 30 minutes.  o Check the autoinjector for the following:  ? Expiration date  ? Absence of any cracks or damage  ? The medicine is clear and colorless and does not contain any particles  ? The orange cap is present and securely attached  o Choose your injection site and clean with an alcohol wipe. Allow to air dry completely.  o Pull the orange cap straight off and discard  o Pinch the skin (or stretch) with your thumb and fingers creating an area 2 inches wide  o Maintaining the pinch (or stretch) press the pen to your skin at a 90 degree angle. Firmly push the autoinjector down until the skin stops moving and the yellow safety guard is no longer visible.  o Do not touch the gray start button yet  o When you are ready to inject, press the gray start button. You will hear a click that signals the start of the injection  o Continue to press the pen to your skin and lift your thumb  o The injection may take up to 15 seconds. You will know the injection is complete when the medication window turns yellow. You may also hear a second click.  o Remove the pen from your skin and discard the pen in a sharps container. o If there is blood at the injection site, press a cotton ball or gauze to the site. Do not rub the injection site.    Adherence/Missed dose instructions: Administer a missed dose within 7 days and resume your normal schedule.  If it has been more than 7 days and you inject every 2 weeks, skip the missed dose and resume your normal schedule..     Goals of Therapy     Lower cholesterol, prevention of cardiovascular events in patients with established cardiovascular disease    Side Effects & Monitoring Parameters     ? Flu-like symptoms  ? Signs of a common cold  ? Back pain  ? Injection site irritation  ? Nose or throat irritation    The following side effects should be reported to the provider:  ? Signs of an allergic reaction  ? Signs of high blood sugar (confusion, drowsiness, increase thirst/hunger/urination, fast breathing, flushing)      Contraindications, Warnings, & Precautions     ? Latex (the packaging of Repatha may contain natural rubber)    Drug/Food Interactions     ? Medication list reviewed in Epic. The patient was instructed to inform the care team before taking any new medications or supplements. No drug interactions identified.     Storage, Handling Precautions, & Disposal   ?  Repatha should be stored in the refrigerator. If necessary, Repatha may be kept at room temperature for no more than 30 days.  ? Place used devices in a sharps container for disposal.    ** Due to currently taking this medication and feeling comfortable with the information, the patient declined the following portions of the counseling outlined above: administration; adherence / missed dose instructions; goals of therapy; side effects and monitoring parameters; contraindications, warnings, and precautions; drug/food interactions; storage, handling precautions, and disposal **      Current Medications (including OTC/herbals), Comorbidities and Allergies     Current Outpatient Medications   Medication Sig Dispense Refill ??? evolocumab (REPATHA SURECLICK) 140 mg/mL PnIj Inject the contents of 1 pen (140mg ) under the skin every other week. 2 mL 3   ??? evolocumab (REPATHA SURECLICK) 140 mg/mL PnIj Inject the contents of 1 pen (140mg ) under the skin every other week. 2 mL 3     No current facility-administered medications for this visit.        Not on File    There is no problem list on file for this patient.      Reviewed and up to date in Epic.    Appropriateness of Therapy     Is medication and dose appropriate based on diagnosis? Yes    Baseline Quality of Life Assessment      How many days over the past month did your hyperlipidemia keep you from your normal activities? 0    Financial Information     Medication Assistance provided: None Required    Anticipated copay of $0 reviewed with patient. Verified delivery address.    Delivery Information     Scheduled delivery date: 01/01/2019    Expected start date: n/a; Mr. Hevia currently is taking this medication, his next dose is due 12/28/2018 bu the knows to use his first injection the day he receives his shipment    Medication will be delivered via UPS to the home address in [place].  This shipment will not require a signature.      Explained the services we provide at [facility] and that each month we would call to set up refills.  Stressed importance of returning phone calls so that we could ensure they receive their medications in time each month.  Informed patient that we should be setting up refills 7-10 days prior to when they will run out of medication.  A pharmacist will reach out to perform a clinical assessment periodically.  Informed patient that a welcome packet and a drug information handout will be sent. Patient verbalized understanding of the above information as well as how to contact the pharmacy at [phone] option 4 with any questions/concerns.  The pharmacy is open Monday through Friday 8:30am-4:30pm.  A pharmacist is available 24/7 via pager to answer any clinical questions they may have.    Patient Specific Needs     ? Does the patient have any physical, cognitive, or cultural barriers? No    ? Patient prefers to have medications discussed with  Patient     ? Is the patient able to read and understand education materials at a high school level or above? Yes    ? Patient's primary language is  English     ? Is the patient high risk? No     ? Does the patient require a Care Management Plan? No     ? Does the patient require physician intervention or other  additional services (i.e. nutrition, smoking cessation, social work)? No      Lupita Shutter, PharmD  [facility] Specialty Pharmacist

## 2018-12-28 MED ORDER — EMPTY CONTAINER: each | 0 refills | 0 days

## 2018-12-31 MED FILL — EMPTY CONTAINER: 30 days supply | Qty: 1 | Fill #0

## 2018-12-31 MED FILL — REPATHA SURECLICK 140 MG/ML SUBCUTANEOUS PEN INJECTOR: 28 days supply | Qty: 2 | Fill #0

## 2018-12-31 MED FILL — REPATHA SURECLICK 140 MG/ML SUBCUTANEOUS PEN INJECTOR: 28 days supply | Qty: 2 | Fill #0 | Status: AC

## 2018-12-31 MED FILL — EMPTY CONTAINER: 30 days supply | Qty: 1 | Fill #0 | Status: AC

## 2019-01-14 NOTE — Unmapped (Signed)
[facility] Clinical Assessment & Refill Coordination Note    Jeff Scott, DOB: 14-Dec-1960  Phone: [phone] (home)     All above HIPAA information was verified with patient.     Specialty Medication(s):   General Specialty: Repatha     Current Outpatient Medications   Medication Sig Dispense Refill   ??? empty container Misc Use as directed 1 each PRN   ??? evolocumab (REPATHA SURECLICK) 140 mg/mL PnIj Inject the contents of 1 pen (140mg ) under the skin every other week. 2 mL 3   ??? evolocumab (REPATHA SURECLICK) 140 mg/mL PnIj Inject the contents of 1 pen (140mg ) under the skin every other week. 2 mL 3     No current facility-administered medications for this visit.         Changes to medications: [name] reports no changes at this time.    Not on File    Changes to allergies: No    SPECIALTY MEDICATION ADHERENCE     Repatha 140 mg/ml: 0 days of medicine on hand     Medication Adherence    Patient reported X missed doses in the last month: 0  Specialty Medication: Repatha  Patient is on additional specialty medications: No  Informant: patient          Specialty medication(s) dose(s) confirmed: Regimen is correct and unchanged.     Are there any concerns with adherence? No    Adherence counseling provided? Not needed    CLINICAL MANAGEMENT AND INTERVENTION      Clinical Benefit Assessment:    Do you feel the medicine is effective or helping your condition? Yes    Clinical Benefit counseling provided? Not needed    Adverse Effects Assessment:    Are you experiencing any side effects? No    Are you experiencing difficulty administering your medicine? No    Quality of Life Assessment:    How many days over the past month did your hyperlipidemia  keep you from your normal activities? For example, brushing your teeth or getting up in the morning. 0    Have you discussed this with your provider? Not needed    Therapy Appropriateness: Is therapy appropriate? Yes, therapy is appropriate and should be continued    DISEASE/MEDICATION-SPECIFIC INFORMATION      For patients on injectable medications: Patient currently has 0 doses left.  Next injection is scheduled for 01/26/19.    PATIENT SPECIFIC NEEDS     ? Does the patient have any physical, cognitive, or cultural barriers? No    ? Is the patient high risk? No     ? Does the patient require a Care Management Plan? No     ? Does the patient require physician intervention or other additional services (i.e. nutrition, smoking cessation, social work)? No      SHIPPING     Specialty Medication(s) to be Shipped:   General Specialty: Repatha    Other medication(s) to be shipped: none     Changes to insurance: No    Delivery Scheduled: Yes, Expected medication delivery date: 01/16/19.     Medication will be delivered via UPS to the confirmed prescription address in [facility].    The patient will receive a drug information handout for each medication shipped and additional FDA Medication Guides as required.  Verified that patient has previously received a Conservation officer, historic buildings.    All of the patient's questions and concerns have been addressed.    [name]  [facility] Specialty Pharmacist

## 2019-01-15 NOTE — Unmapped (Signed)
Jeff Scott 's Repatha shipment will be delayed as a result of the medication is too soon to refill until 01/25/19.     I have reached out to the patient and left a voicemail message.  We will wait for a call back from the patient to reschedule the delivery.  We have not confirmed the new delivery date.

## 2019-01-16 NOTE — Unmapped (Signed)
Patient is aware of shipment delay due to medication being refill too soon. Shipment scheduled for 11/13 through [facility].

## 2019-01-25 MED FILL — REPATHA SURECLICK 140 MG/ML SUBCUTANEOUS PEN INJECTOR: 28 days supply | Qty: 2 | Fill #1

## 2019-01-30 IMAGING — MR MR CERVICAL SPINE W/ CM
4 series · 21 of 48 positions shown · IV contrast (Yes)
Comparison: MRI of the cervical spine without contrast January 31, 2018

CLINICAL DATA: Follow-up abnormal enhancement

EXAM:
MRI CERVICAL SPINE WITH CONTRAST
TECHNIQUE: Multiplanar, multisequence MR imaging of the cervical spine was
performed following the administration of intravenous contrast.

[Series 2: T2 post-contrast · sagittal · 3.0mm · 0.43mm/px · 5 of 12 slices shown]
[im 1/12]
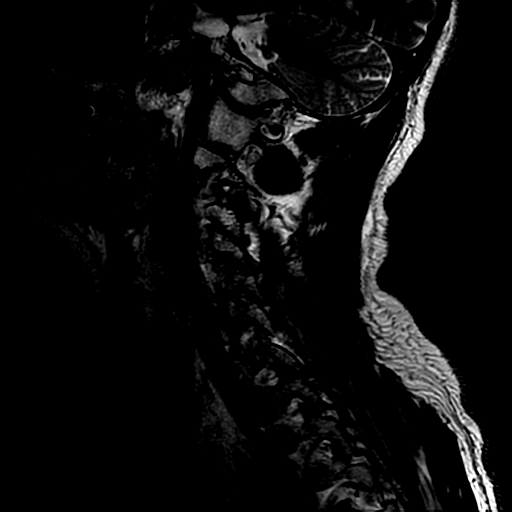
[im 3/12]
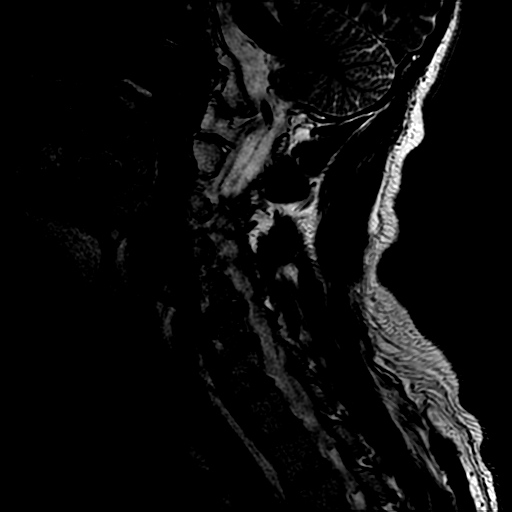
[im 6/12]
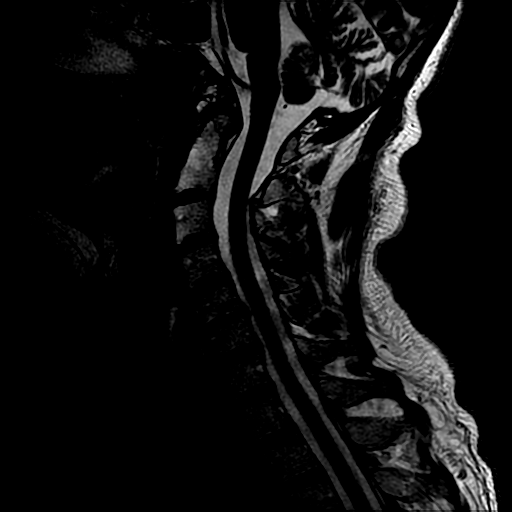
[im 9/12]
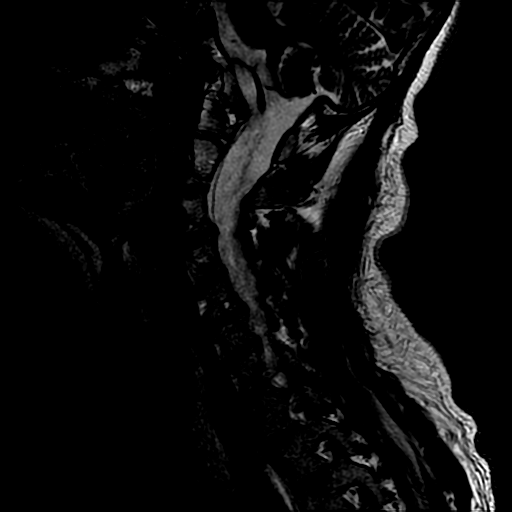
[im 12/12]
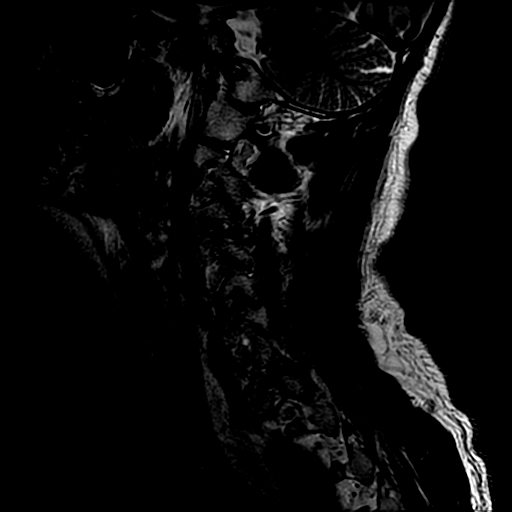

[Series 3: T1 fat-sat post-contrast · sagittal · 3.0mm · 0.43mm/px · 6 of 12 slices shown]
[im 1/12]
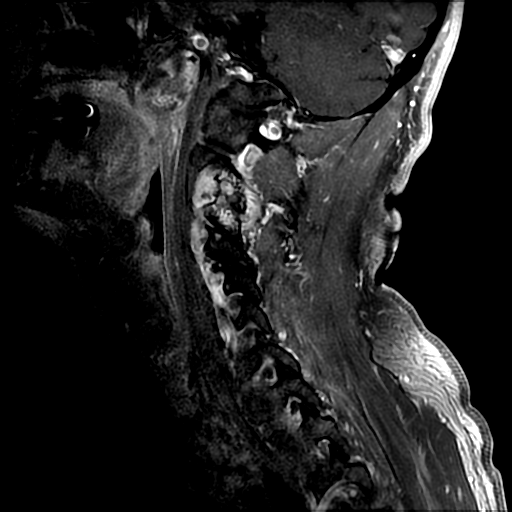
[im 3/12]
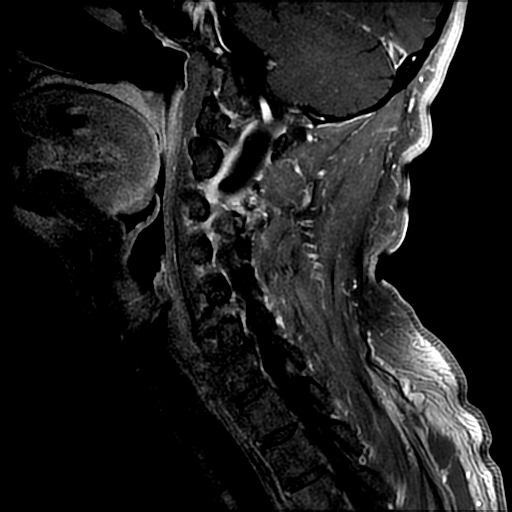
[im 5/12]
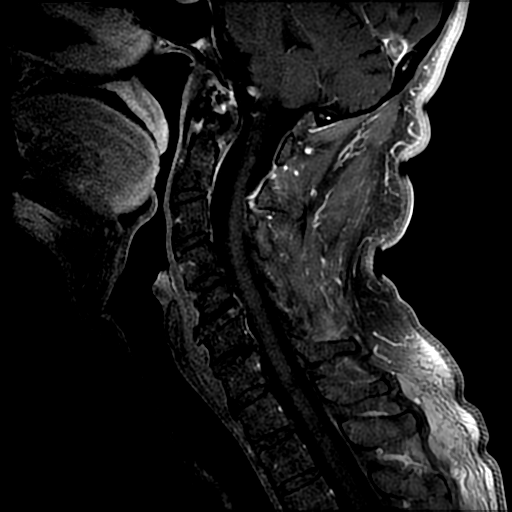
[im 7/12]
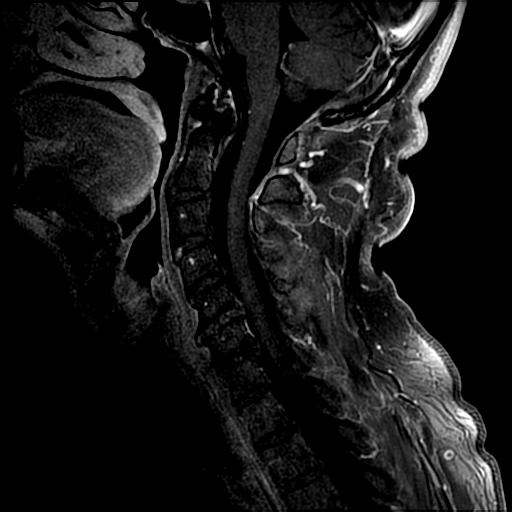
[im 9/12]
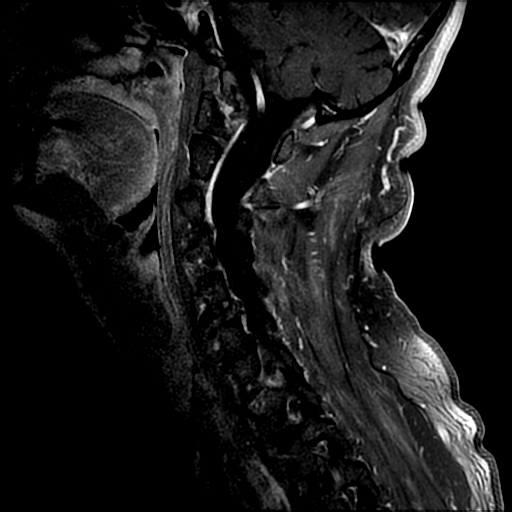
[im 12/12]
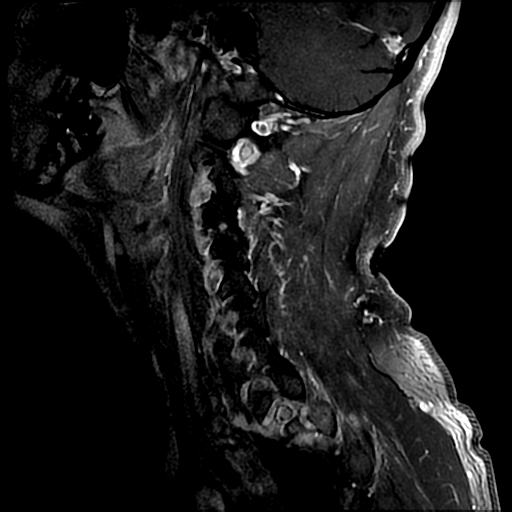

[Series 4: T1 post-contrast · axial · 3.0mm · 0.37mm/px · z∈[-73,+64]mm · 7 of 50 slices shown (1 of 2)]
[im 3/50]
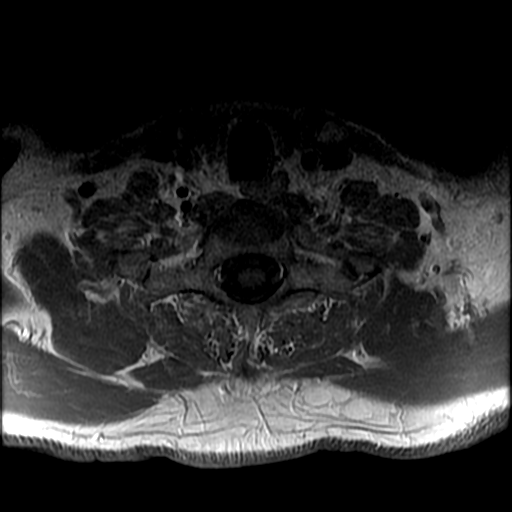
[im 9/50]
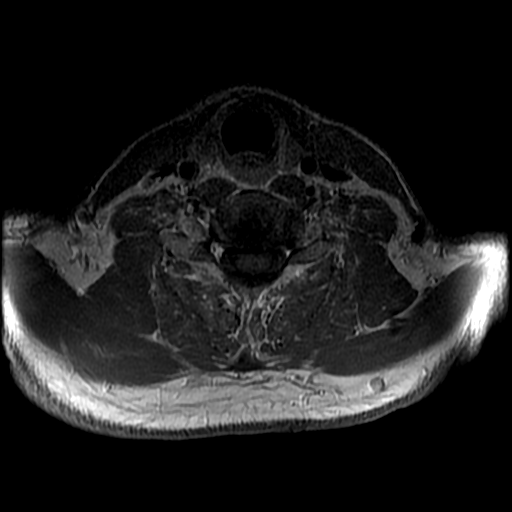
[im 15/50]
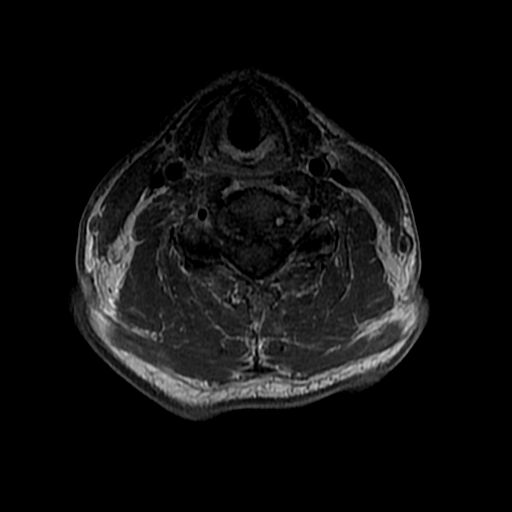
[im 22/50]
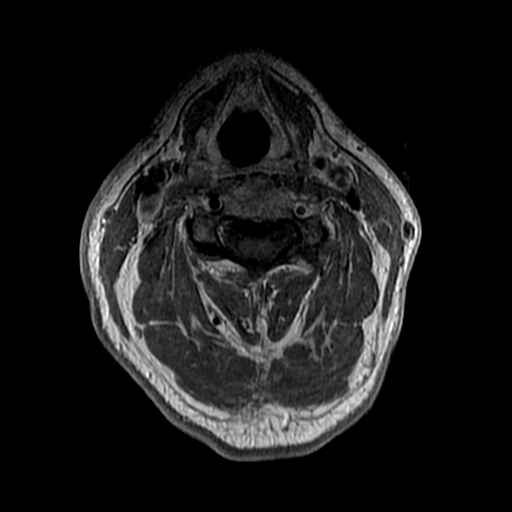
[im 26/50]
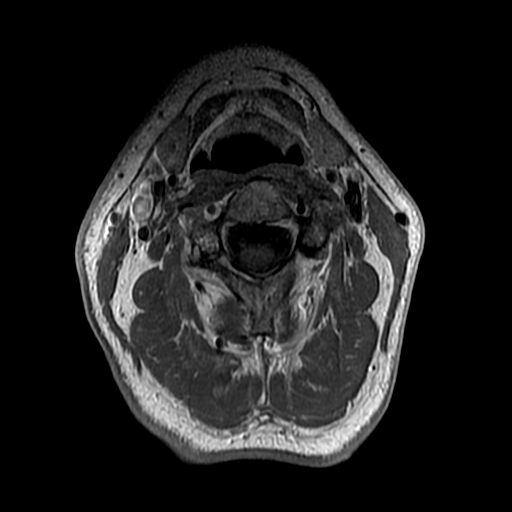
[im 28/50]
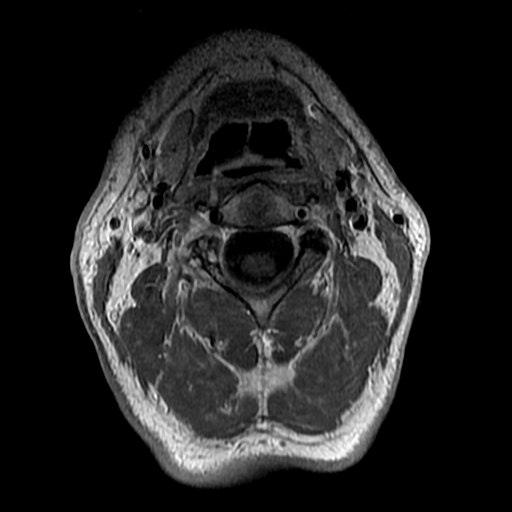
[im 43/50]
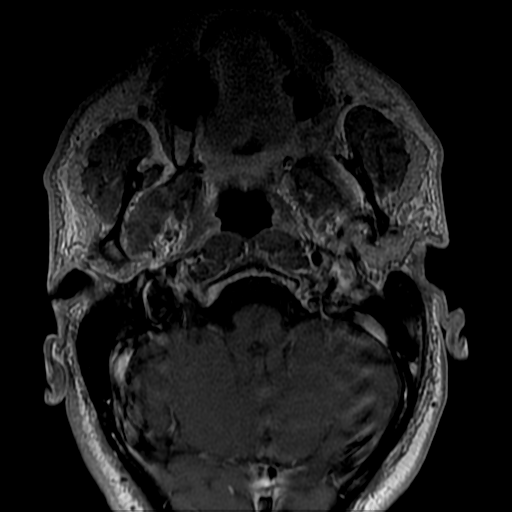

[Series 6: T1 post-contrast · coronal · 3.0mm · 1.25mm/px · 3 of 27 slices shown (2 of 2)]
[im 5/27]
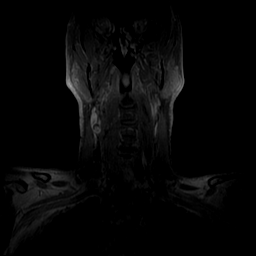
[im 14/27]
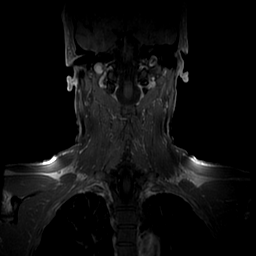
[im 22/27]
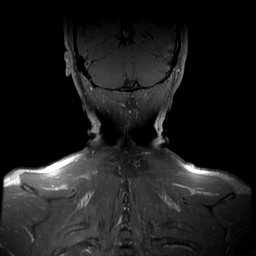

[21 of 48 positions shown; findings below may reference images not displayed]

FINDINGS: Alignment: No malalignment.

Vertebrae: Vertebral bodies intact. Enhancement RIGHT C3-4 facets
associated with severe arthropathy. No additional areas of abnormal
bone marrow enhancement. Mild enhancement C5-6 toward the RIGHT disc
protrusion and ventral C3-4 associated with annular fissure.

Cord: No abnormal spinal cord, leptomeningeal or epidural
enhancement.

Posterior Fossa, vertebral arteries, paraspinal tissues: Mild
enhancement of the surrounding paraspinal muscles. No focal fluid
collection. Corresponding bright STIR signal on prior MRI.

Disc levels:

Limited post gadolinium assessment not tailored for evaluation.
IMPRESSION: 1. Acute inflammatory changes RIGHT C3-4 facet associated with
severe arthropathy. Associated mild paraspinal muscle strain.
2. Mild enhancement C3-4 and C5-6 discs associated with annular
fissures and spondylosis.

## 2019-02-11 NOTE — Unmapped (Signed)
[facility] Refill Coordination Note    Specialty Medication(s) to be Shipped:   General Specialty: Repatha    Other medication(s) to be shipped: n/a     Philis Nettle, DOB: 07/18/1960  Phone: [phone] (home)       All above HIPAA information was verified with patient.     Completed refill call assessment today to schedule patient's medication shipment from the [facility] [phone]).       Specialty medication(s) and dose(s) confirmed: Regimen is correct and unchanged.   Changes to medications: Yaqub reports no changes at this time.  Changes to insurance: No  Questions for the pharmacist: No    Confirmed patient received Welcome Packet with first shipment. The patient will receive a drug information handout for each medication shipped and additional FDA Medication Guides as required.       DISEASE/MEDICATION-SPECIFIC INFORMATION        For patients on injectable medications: Patient currently has 0 doses left.  Next injection is scheduled for 02/23/2019.    SPECIALTY MEDICATION ADHERENCE     Medication Adherence    Patient reported X missed doses in the last month: 0  Specialty Medication: repatha 140mg /ml  Patient is on additional specialty medications: No  Any gaps in refill history greater than 2 weeks in the last 3 months: no  Demonstrates understanding of importance of adherence: yes  Informant: patient  Reliability of informant: reliable  Confirmed plan for next specialty medication refill: delivery by pharmacy  Refills needed for supportive medications: not needed                repatha 140mg /ml. 0 on hand      SHIPPING     Shipping address confirmed in Epic.     Delivery Scheduled: Yes, Expected medication delivery date: 02/19/2019.     Medication will be delivered via UPS to the prescription address in Epic WAM.    Stormee Duda D Khara Renaud   [facility] Specialty Technician

## 2019-02-18 NOTE — Unmapped (Signed)
Jeff Scott 's Repatha shipment will be delayed as a result of the medication is too soon to refill until 02/19/2019.     I have reached out to the patient and communicated the delivery change. We will reschedule the medication for the delivery date that the patient agreed upon.  We have confirmed the delivery date as 02/20/2019

## 2019-02-19 MED FILL — REPATHA SURECLICK 140 MG/ML SUBCUTANEOUS PEN INJECTOR: 28 days supply | Qty: 2 | Fill #2

## 2019-02-19 MED FILL — REPATHA SURECLICK 140 MG/ML SUBCUTANEOUS PEN INJECTOR: 28 days supply | Qty: 2 | Fill #2 | Status: AC

## 2019-03-05 NOTE — Unmapped (Signed)
[facility] Refill Coordination Note    Specialty Medication(s) to be Shipped:   General Specialty: Repatha    Other medication(s) to be shipped:       Jeff Scott, DOB: 12-08-1960  Phone: [phone] (home)       All above HIPAA information was verified with patient.     Was a Nurse, learning disability used for this call? No    Completed refill call assessment today to schedule patient's medication shipment from the [facility] [phone]).       Specialty medication(s) and dose(s) confirmed: Regimen is correct and unchanged.   Changes to medications: Jeff Scott reports no changes at this time.  Changes to insurance: No  Questions for the pharmacist: No    Confirmed patient received Welcome Packet with first shipment. The patient will receive a drug information handout for each medication shipped and additional FDA Medication Guides as required.       DISEASE/MEDICATION-SPECIFIC INFORMATION        For patients on injectable medications: Patient currently has 1 doses left.  Next injection is scheduled for 01/01.    SPECIALTY MEDICATION ADHERENCE     Medication Adherence    Patient reported X missed doses in the last month: 0  Specialty Medication: repatha 140mg /ml  Patient is on additional specialty medications: No  Informant: patient                repatha  140 mg/ml: 0 days of medicine on hand         SHIPPING     Shipping address confirmed in Epic.     Delivery Scheduled: Yes, Expected medication delivery date: 12/30.     Medication will be delivered via UPS to the prescription address in Epic WAM.    Antonietta Barcelona   [facility] Specialty Technician

## 2019-03-12 NOTE — Unmapped (Signed)
Jeff Scott 's Repatha shipment will be delayed as a result of the medication is too soon to refill until 01/02.     I have reached out to the patient and communicated the delivery change. We will reschedule the medication for the delivery date that the patient agreed upon.  We have confirmed the delivery date as 01/05, via ups.

## 2019-03-18 DIAGNOSIS — E782 Mixed hyperlipidemia: Principal | ICD-10-CM

## 2019-03-18 MED FILL — REPATHA SURECLICK 140 MG/ML SUBCUTANEOUS PEN INJECTOR: 28 days supply | Qty: 2 | Fill #3 | Status: AC

## 2019-03-18 MED FILL — REPATHA SURECLICK 140 MG/ML SUBCUTANEOUS PEN INJECTOR: 28 days supply | Qty: 2 | Fill #3

## 2019-04-08 NOTE — Unmapped (Signed)
[facility] Refill Coordination Note    Specialty Medication(s) to be Shipped:   General Specialty: Repatha    Other medication(s) to be shipped: n/a     Jeff Scott, DOB: [birth year]  Phone: [phone] (home)       All above HIPAA information was verified with patient.     Was a Nurse, learning disability used for this call? No    Completed refill call assessment today to schedule patient's medication shipment from the [facility] [phone]).       Specialty medication(s) and dose(s) confirmed: Regimen is correct and unchanged.   Changes to medications: [name] reports no changes at this time.  Changes to insurance: No  Questions for the pharmacist: No    Confirmed patient received Welcome Packet with first shipment. The patient will receive a drug information handout for each medication shipped and additional FDA Medication Guides as required.       DISEASE/MEDICATION-SPECIFIC INFORMATION        For patients on injectable medications: Patient currently has 1 doses left.  Next injection is scheduled for 2/5.    SPECIALTY MEDICATION ADHERENCE     Medication Adherence    Patient reported X missed doses in the last month: 0  Specialty Medication: Repatha  Patient is on additional specialty medications: No  Patient is on more than two specialty medications: No  Any gaps in refill history greater than 2 weeks in the last 3 months: no  Demonstrates understanding of importance of adherence: yes  Informant: patient                Repatha 140mg /ml: Patient has 14 days of medication on hand      SHIPPING     Shipping address confirmed in Epic.     Delivery Scheduled: Yes, Expected medication delivery date: 2/4.     Medication will be delivered via UPS to the prescription address in Epic WAM.    Olga Millers   [facility] Specialty Technician

## 2019-04-17 MED FILL — REPATHA SURECLICK 140 MG/ML SUBCUTANEOUS PEN INJECTOR: 28 days supply | Qty: 2 | Fill #0

## 2019-04-17 MED FILL — REPATHA SURECLICK 140 MG/ML SUBCUTANEOUS PEN INJECTOR: 28 days supply | Qty: 2 | Fill #0 | Status: AC

## 2019-05-09 NOTE — Unmapped (Signed)
[facility] Refill Coordination Note    Specialty Medication(s) to be Shipped:   General Specialty: Repatha    Other medication(s) to be shipped: N/A     Philis Nettle, DOB: 1960/04/13  Phone: [phone] (home)       All above HIPAA information was verified with patient.     Was a Nurse, learning disability used for this call? No    Completed refill call assessment today to schedule patient's medication shipment from the [facility] [phone]).       Specialty medication(s) and dose(s) confirmed: Regimen is correct and unchanged.   Changes to medications: Param reports no changes at this time.  Changes to insurance: No  Questions for the pharmacist: No    Confirmed patient received Welcome Packet with first shipment. The patient will receive a drug information handout for each medication shipped and additional FDA Medication Guides as required.       DISEASE/MEDICATION-SPECIFIC INFORMATION        For patients on injectable medications: Patient currently has 0 doses left.  Next injection is scheduled for 05/18/19.    SPECIALTY MEDICATION ADHERENCE     Medication Adherence    Patient reported X missed doses in the last month: 0  Specialty Medication: Repatha  Patient is on additional specialty medications: No                Repatha 140 mg/ml: 0 days of medicine on hand         SHIPPING     Shipping address confirmed in Epic.     Delivery Scheduled: Yes, Expected medication delivery date: 05/16/19.     Medication will be delivered via UPS to the prescription address in Epic WAM.    Nancy Nordmann [facility] Specialty Technician

## 2019-05-15 MED FILL — REPATHA SURECLICK 140 MG/ML SUBCUTANEOUS PEN INJECTOR: 28 days supply | Qty: 2 | Fill #1 | Status: AC

## 2019-05-15 MED FILL — REPATHA SURECLICK 140 MG/ML SUBCUTANEOUS PEN INJECTOR: 28 days supply | Qty: 2 | Fill #1

## 2019-06-07 NOTE — Unmapped (Signed)
[facility] Clinical Assessment & Refill Coordination Note    Jeff Scott, DOB: 06/22/1960  Phone: [phone] (home)     All above HIPAA information was verified with patient.     Was a Nurse, learning disability used for this call? No    Specialty Medication(s):   General Specialty: Repatha     Current Outpatient Medications   Medication Sig Dispense Refill   ??? empty container Misc Use as directed 1 each PRN   ??? evolocumab (REPATHA SURECLICK) 140 mg/mL PnIj Inject the contents of 1 pen (140mg ) under the skin every other week. 2 mL 3   ??? evolocumab (REPATHA SURECLICK) 140 mg/mL PnIj Inject the contents of 1 pen (140mg ) under the skin every other week. 2 mL 3     No current facility-administered medications for this visit.         Changes to medications: Ashawn reports no changes at this time.    Allergies   Allergen Reactions   ??? Penicillin G Rash       Changes to allergies: No    SPECIALTY MEDICATION ADHERENCE     Repatha 140 mg/ml: 7 days of medicine on hand       Medication Adherence    Patient reported X missed doses in the last month: 0  Specialty Medication: Repatha 140mg /ml  Patient is on additional specialty medications: No          Specialty medication(s) dose(s) confirmed: Regimen is correct and unchanged.     Are there any concerns with adherence? No    Adherence counseling provided? Not needed    CLINICAL MANAGEMENT AND INTERVENTION      Clinical Benefit Assessment:    Do you feel the medicine is effective or helping your condition? Yes    Clinical Benefit counseling provided? Not needed    Adverse Effects Assessment:    Are you experiencing any side effects? No    Are you experiencing difficulty administering your medicine? No    Quality of Life Assessment:    How many days over the past month did your hyperlipidemia  keep you from your normal activities? For example, brushing your teeth or getting up in the morning. 0    Have you discussed this with your provider? Not needed    Therapy Appropriateness:    Is therapy appropriate? Yes, therapy is appropriate and should be continued    DISEASE/MEDICATION-SPECIFIC INFORMATION      N/A    PATIENT SPECIFIC NEEDS     ? Does the patient have any physical, cognitive, or cultural barriers? No    ? Is the patient high risk? No     ? Does the patient require a Care Management Plan? No     ? Does the patient require physician intervention or other additional services (i.e. nutrition, smoking cessation, social work)? No      SHIPPING     Specialty Medication(s) to be Shipped:   General Specialty: Repatha    Other medication(s) to be shipped: none     Changes to insurance: No    Delivery Scheduled: Yes, Expected medication delivery date: 06/13/19.     Medication will be delivered via UPS to the confirmed prescription address in [facility].    The patient will receive a drug information handout for each medication shipped and additional FDA Medication Guides as required.  Verified that patient has previously received a Conservation officer, historic buildings.    All of the patient's questions and concerns have been  addressed.    Tera Helper   [facility] Specialty Pharmacist

## 2019-06-12 MED FILL — REPATHA SURECLICK 140 MG/ML SUBCUTANEOUS PEN INJECTOR: 28 days supply | Qty: 2 | Fill #2

## 2019-06-12 MED FILL — REPATHA SURECLICK 140 MG/ML SUBCUTANEOUS PEN INJECTOR: 28 days supply | Qty: 2 | Fill #2 | Status: AC

## 2019-07-04 NOTE — Unmapped (Signed)
[facility] Refill Coordination Note    Specialty Medication(s) to be Shipped:   General Specialty: Repatha 140MG /ML     Jeff Scott, DOB: [birth year]  Phone: [phone] (home)     All above HIPAA information was verified with patient.     Was a Nurse, learning disability used for this call? No    Completed refill call assessment today to schedule patient's medication shipment from the [facility] [phone]).       Specialty medication(s) and dose(s) confirmed: Regimen is correct and unchanged.   Changes to medications: Jeff Scott reports no changes at this time.  Changes to insurance: No  Questions for the pharmacist: No    Confirmed patient received Welcome Packet with first shipment. The patient will receive a drug information handout for each medication shipped and additional FDA Medication Guides as required.       DISEASE/MEDICATION-SPECIFIC INFORMATION        For patients on injectable medications: Patient currently has 0 doses left.  Next injection is scheduled for 07/13/2019.    SPECIALTY MEDICATION ADHERENCE     Medication Adherence    Patient reported X missed doses in the last month: 0  Specialty Medication: Repatha 140mg /ml  Patient is on additional specialty medications: No  Informant: patient  Reliability of informant: reliable        SHIPPING     Shipping address confirmed in Epic.     Delivery Scheduled: Yes, Expected medication delivery date: 07/09/2019.     Medication will be delivered via UPS to the prescription address in Epic WAM.    Montee Tallman P [facility] [place] Shared [facility] Specialty Technician

## 2019-07-08 MED FILL — REPATHA SURECLICK 140 MG/ML SUBCUTANEOUS PEN INJECTOR: 28 days supply | Qty: 2 | Fill #3

## 2019-07-08 MED FILL — REPATHA SURECLICK 140 MG/ML SUBCUTANEOUS PEN INJECTOR: 28 days supply | Qty: 2 | Fill #3 | Status: AC

## 2019-07-30 MED ORDER — EVOLOCUMAB 140 MG/ML SUBCUTANEOUS PEN INJECTOR
3 refills | 0 days
Start: 2019-07-30 — End: ?

## 2019-07-30 NOTE — Unmapped (Signed)
[facility] Refill Coordination Note    Specialty Medication(s) to be Shipped:   General Specialty: Repatha    Other medication(s) to be shipped: N/A     Jeff Scott, DOB: 17-Feb-1961  Phone: [phone] (home)       All above HIPAA information was verified with patient.     Was a Nurse, learning disability used for this call? No    Completed refill call assessment today to schedule patient's medication shipment from the [facility] [phone]).       Specialty medication(s) and dose(s) confirmed: Regimen is correct and unchanged.   Changes to medications: [name] reports no changes at this time.  Changes to insurance: No  Questions for the pharmacist: No    Confirmed patient received Welcome Packet with first shipment. The patient will receive a drug information handout for each medication shipped and additional FDA Medication Guides as required.       DISEASE/MEDICATION-SPECIFIC INFORMATION        For patients on injectable medications: Patient currently has 0 doses left.  Next injection is scheduled for 08/09/19.    SPECIALTY MEDICATION ADHERENCE     Medication Adherence    Patient reported X missed doses in the last month: 0  Specialty Medication: Repatha  Patient is on additional specialty medications: No                Repatha 140 mg/ml: 0 days of medicine on hand         SHIPPING     Shipping address confirmed in Epic.     Delivery Scheduled: Yes, Expected medication delivery date: 08/06/19.  However, Rx request for refills was sent to the provider as there are none remaining.     Medication will be delivered via UPS to the prescription address in Epic WAM.    Nancy Nordmann [facility] Specialty Technician

## 2019-08-05 MED FILL — REPATHA SURECLICK 140 MG/ML SUBCUTANEOUS PEN INJECTOR: 28 days supply | Qty: 2 | Fill #0 | Status: AC

## 2019-08-05 MED FILL — REPATHA SURECLICK 140 MG/ML SUBCUTANEOUS PEN INJECTOR: SUBCUTANEOUS | 28 days supply | Qty: 2 | Fill #0

## 2019-08-29 NOTE — Unmapped (Signed)
[facility] Refill Coordination Note    Specialty Medication(s) to be Shipped:   General Specialty: Repatha    Other medication(s) to be shipped: N/A     Jeff Scott, DOB: [birth year]  Phone: [phone] (home)       All above HIPAA information was verified with patient.     Was a Nurse, learning disability used for this call? No    Completed refill call assessment today to schedule patient's medication shipment from the [facility] [phone]).       Specialty medication(s) and dose(s) confirmed: Regimen is correct and unchanged.   Changes to medications: Jeff Scott reports no changes at this time.  Changes to insurance: No  Questions for the pharmacist: No    Confirmed patient received Welcome Packet with first shipment. The patient will receive a drug information handout for each medication shipped and additional FDA Medication Guides as required.       DISEASE/MEDICATION-SPECIFIC INFORMATION        For patients on injectable medications: Patient currently has 0 doses left.  Next injection is scheduled for 09/06/2019.    SPECIALTY MEDICATION ADHERENCE     Medication Adherence    Patient reported X missed doses in the last month: 0  Specialty Medication: Repatha 140mg /ml  Patient is on additional specialty medications: No  Any gaps in refill history greater than 2 weeks in the last 3 months: no  Demonstrates understanding of importance of adherence: yes  Informant: patient  Reliability of informant: reliable  Confirmed plan for next specialty medication refill: delivery by pharmacy  Refills needed for supportive medications: not needed                Repatha 140 mg/ml: 0 days of medicine on hand         SHIPPING     Shipping address confirmed in Epic.     Delivery Scheduled: Yes, Expected medication delivery date: 09/03/2019.     Medication will be delivered via UPS to the prescription address in Epic WAM.    Eily Louvier D Loys Hoselton   [facility] Shared [facility] Specialty Technician

## 2019-09-02 DIAGNOSIS — E782 Mixed hyperlipidemia: Principal | ICD-10-CM

## 2019-09-02 NOTE — Unmapped (Signed)
Jeff Scott 's REPATHA shipment will be delayed as a result of prior authorization being required by the patient's insurance.     I have reached out to the patient and left a voicemail message.  We will call the patient back to reschedule the delivery upon resolution. We have not confirmed the new delivery date.

## 2019-09-03 NOTE — Unmapped (Signed)
Jeff Scott called back about the delivery for Repatha and would like the delivery to ship out 09/04/19 via UPS or [facility] [facility] to be delivered 09/05/19 . We have confirmed the delivery via UPS delivery .

## 2019-09-03 NOTE — Unmapped (Signed)
6/22 No PA needed, just rfts until 6/22. Claim now adjudicates successfully for $0. LM for patient to reschedule delivery and update epic documentation. KFR

## 2019-09-04 MED FILL — REPATHA SURECLICK 140 MG/ML SUBCUTANEOUS PEN INJECTOR: SUBCUTANEOUS | 28 days supply | Qty: 2 | Fill #1

## 2019-09-04 MED FILL — REPATHA SURECLICK 140 MG/ML SUBCUTANEOUS PEN INJECTOR: 28 days supply | Qty: 2 | Fill #1 | Status: AC

## 2019-09-20 NOTE — Unmapped (Signed)
[facility] Refill Coordination Note    Specialty Medication(s) to be Shipped:   General Specialty: Repatha    Other medication(s) to be shipped: N/A     Jeff Scott, DOB: January 09, 1961  Phone: [phone] (home)       All above HIPAA information was verified with patient.     Was a Nurse, learning disability used for this call? No    Completed refill call assessment today to schedule patient's medication shipment from the [facility] [phone]).       Specialty medication(s) and dose(s) confirmed: Regimen is correct and unchanged.   Changes to medications: Keita reports no changes at this time.  Changes to insurance: No  Questions for the pharmacist: No    Confirmed patient received Welcome Packet with first shipment. The patient will receive a drug information handout for each medication shipped and additional FDA Medication Guides as required.       DISEASE/MEDICATION-SPECIFIC INFORMATION        For patients on injectable medications: Patient currently has 1 doses left.  Next injection is scheduled for 09/21/19.    SPECIALTY MEDICATION ADHERENCE     Medication Adherence    Patient reported X missed doses in the last month: 0  Specialty Medication: Repatha 140mg /ml  Patient is on additional specialty medications: No                Repatha 140 mg/ml: 1 days of medicine on hand         SHIPPING     Shipping address confirmed in Epic.     Delivery Scheduled: Yes, Expected medication delivery date: 10/02/19.     Medication will be delivered via UPS to the prescription address in Epic WAM.    Nancy Nordmann [facility] Specialty Technician

## 2019-10-01 MED FILL — REPATHA SURECLICK 140 MG/ML SUBCUTANEOUS PEN INJECTOR: SUBCUTANEOUS | 28 days supply | Qty: 2 | Fill #2

## 2019-10-01 MED FILL — REPATHA SURECLICK 140 MG/ML SUBCUTANEOUS PEN INJECTOR: 28 days supply | Qty: 2 | Fill #2 | Status: AC

## 2019-10-04 ENCOUNTER — Telehealth: Payer: Self-pay | Admitting: *Deleted

## 2019-10-04 NOTE — Telephone Encounter (Signed)
Patient left message asking if he could come today to get STI testing.  Patient has not been seen by Dr Tommy Medal since 2018, has been off PrEP for some time. RN advised patient that we were not set up for walk-in STI testing, answered his questions about restarting PrEP.  Patient will be out of town when Dr Tommy Medal is available next, so RN offered visits with pharmacist Cassie.  He agreed. RN recommended he contact PCP or urgent care regarding his need for STI testing. Landis Gandy, RN

## 2019-10-16 ENCOUNTER — Other Ambulatory Visit: Payer: Self-pay

## 2019-10-16 ENCOUNTER — Ambulatory Visit (INDEPENDENT_AMBULATORY_CARE_PROVIDER_SITE_OTHER): Payer: BC Managed Care – PPO | Admitting: Pharmacist

## 2019-10-16 ENCOUNTER — Other Ambulatory Visit (HOSPITAL_COMMUNITY)
Admission: RE | Admit: 2019-10-16 | Discharge: 2019-10-16 | Disposition: A | Discharge status: Home / Self Care | Payer: BC Managed Care – PPO | Source: Ambulatory Visit | Attending: Infectious Disease | Admitting: Infectious Disease

## 2019-10-16 DIAGNOSIS — Z113 Encounter for screening for infections with a predominantly sexual mode of transmission: Secondary | ICD-10-CM | POA: Diagnosis present

## 2019-10-16 DIAGNOSIS — Z1152 Encounter for screening for COVID-19: Secondary | ICD-10-CM

## 2019-10-16 DIAGNOSIS — Z7253 High risk bisexual behavior: Secondary | ICD-10-CM

## 2019-10-16 DIAGNOSIS — Z79899 Other long term (current) drug therapy: Secondary | ICD-10-CM | POA: Diagnosis not present

## 2019-10-16 NOTE — Progress Notes (Signed)
Date:  10/16/2019   HPI: Richard Hahn is a 59 y.o. male who presents to the [place] clinic to discuss and initiate PrEP.  Insured   [x]    Uninsured  []    Patient Active Problem List   Diagnosis Date Noted  . Inflammation of bone ([place]) 02/02/2018  . Cervical radiculopathy 01/17/2018  . Arm pain 01/17/2018  . Gonorrhea 11/02/2016  . Exposure to HIV 06/27/2016  . Counseling related to patient's sexual behavior and orientation 06/27/2016  . High risk sexual behavior 04/06/2015  . Encounter for screening for infections with predominantly sexual mode of transmission 04/06/2015  . Other problems related to lifestyle 04/06/2015  . ADHD (attention deficit hyperactivity disorder)   . Low testosterone   . Hypothyroidism   . Neck pain 08/25/2014  . Idiopathic scoliosis 08/25/2014  . Numbness 08/25/2014  . Low back pain radiating to left lower extremity 08/25/2014  . Actinic keratoses 09/06/2011  . Multiple benign melanocytic nevi 09/06/2011    Patient's Medications  New Prescriptions   No medications on file  Previous Medications   ALPRAZOLAM (XANAX) 0.5 MG TABLET    Take 0.5 mg by mouth as needed.    ETODOLAC (LODINE) 400 MG TABLET    Take 1 tablet (400 mg total) by mouth 2 (two) times daily.   LIVALO 1 MG TABS    TK 1 T PO 1 TO 2 TIMES A WEEK   MULTIPLE VITAMIN (MULTIVITAMIN) TABLET    Take 1 tablet by mouth daily.   OMEGA-3 ACID ETHYL ESTERS (LOVAZA) 1 G CAPSULE    Take 2 capsules by mouth 2 (two) times daily.   SUMATRIPTAN (IMITREX) 100 MG TABLET    TK 1 T PO PRF HA. MAY REPEAT DOSE ONCE IN 2 HOURS IF NEEDED. MAX OF 2 TS PER DAY.   TESTOSTERONE CYPIONATE (DEPOTESTOTERONE CYPIONATE) 200 MG/ML INJECTION    INJECT 0.8MLS INTO LATERAL THIGH ONCE WEEKLY   THYROID PO    Take 1 Dose by mouth daily. Nature Throid- 3/4 grain   VIAGRA 100 MG TABLET    TK 1 T PO  D PRN   VYVANSE 30 MG CAPSULE      Modified Medications   No medications on file  Discontinued Medications   No  medications on file    Allergies: Allergies  Allergen Reactions  . Penicillins Hives    Past Medical History: Past Medical History:  Diagnosis Date  . ADHD (attention deficit hyperactivity disorder)   . Counseling related to patient's sexual behavior and orientation 06/27/2016  . Exposure to HIV 06/27/2016  . Gonorrhea 11/02/2016  . Headache   . High risk sexual behavior 04/06/2015  . Hyperlipidemia   . Hypothyroidism   . Low testosterone   . Screen for STD (sexually transmitted disease) 04/06/2015  . Vision abnormalities     Social History: Social History   Socioeconomic History  . Marital status: Unknown    Spouse name: Not on file  . Number of children: Not on file  . Years of education: Not on file  . Highest education level: Not on file  Occupational History  . Not on file  Tobacco Use  . Smoking status: Never Smoker  . Smokeless tobacco: Never Used  Substance and Sexual Activity  . Alcohol use: No    Alcohol/week: 0.0 standard drinks  . Drug use: No  . Sexual activity: Not on file  Other Topics Concern  . Not on file  Social History Narrative   Lives  Caffeine use:    Social Determinants of Radio broadcast assistant Strain:   . Difficulty of Paying Living Expenses:   Food Insecurity:   . Worried About Charity fundraiser in the Last Year:   . Arboriculturist in the Last Year:   Transportation Needs:   . Film/video editor (Medical):   Marland Kitchen Lack of Transportation (Non-Medical):   Physical Activity:   . Days of Exercise per Week:   . Minutes of Exercise per Session:   Stress:   . Feeling of Stress :   Social Connections:   . Frequency of Communication with Friends and Family:   . Frequency of Social Gatherings with Friends and Family:   . Attends Religious Services:   . Active Member of Clubs or Organizations:   . Attends Archivist Meetings:   Marland Kitchen Marital Status:     No flowsheet data found.  Labs:  SCr: Lab Results    Component Value Date   CREATININE 1.18 01/02/2017   CREATININE 1.11 11/02/2016   CREATININE 1.19 09/05/2016   CREATININE 1.05 06/27/2016   CREATININE 1.08 03/17/2016   HIV Lab Results  Component Value Date   HIV NON-REACTIVE 01/02/2017   HIV NONREACTIVE 11/02/2016   HIV NONREACTIVE 09/05/2016   HIV NONREACTIVE 06/27/2016   HIV NONREACTIVE 03/17/2016   Hepatitis B Lab Results  Component Value Date   HEPBSAG NEGATIVE 04/06/2015   Hepatitis C No results found for: HEPCAB, HCVRNAPCRQN Hepatitis A Lab Results  Component Value Date   HAV NON REACTIVE 04/06/2015   RPR and STI Lab Results  Component Value Date   LABRPR NON-REACTIVE 01/02/2017   LABRPR NON REAC 11/02/2016   LABRPR NON REAC 09/05/2016   LABRPR NON REAC 06/27/2016   LABRPR NON REAC 03/17/2016    STI Results GC CT  01/02/2017 Negative Negative  01/02/2017 Negative Negative  01/02/2017 Negative Negative  11/02/2016 Negative Negative  11/02/2016 Negative Negative  11/02/2016 Negative Negative  09/12/2016 Negative Negative  09/05/2016 **POSITIVE**(A) Negative  06/27/2016 Negative Negative  06/27/2016 Negative Negative  06/27/2016 Negative Negative  03/17/2016 Negative Negative  03/17/2016 Negative Negative  03/17/2016 Negative Negative  08/31/2015 *Negative *Negative  08/31/2015 *Negative *Negative  08/31/2015 Negative Negative  06/04/2015 Negative Negative  04/06/2015 *Negative *Negative  04/06/2015 *Negative *Negative    Assessment: Richard Hahn is here today to discuss re-initiating PrEP.  He was last seen here by Dr. Tommy Medal in August 2018. He states that he used to take Truvada but Dr. Tommy Medal was starting to worry about his kidneys, so he stopped taking it. He heard about Descovy and wanted to discuss starting that.  He has male partners and has had 2 in the last 3 months with inconsistent condom use. He states that he isn't very active and only has sex about 2 times per month. He is wondering if the 2-1-1 method would be  something he could explore. I explained the 2-1-1 method and think that may be a good option for him since he had some kidney elevation with Truvada. I told him that I don't recommend it for everyone, but his situation of only having sexual encounters a few times per month would work. I told him that if he becomes more active, he should switch back to taking daily. He is no longer with the HIV positive partner that he was with in 2018. He states that both of his partners lately have been taking Descovy for PrEP.  Explained Descovy and  how to take it. I will update his labs today and send in 3 months if he is HIV negative.  He is also asking for a COVID antibody test. He states that he was previously vaccinated early and wanted to make sure he still had antibodies.  Discussed with Santiago Glad in lab and she stated that we could check and showed me the correct lab to order.  Will see him back in 3 months.  Plan:  - HIV antibody, BMET, RPR, urine/rectal gonorrhea/chlamydia cytology today - Descovy x 3 months if HIV negative - F/u in 3 months  Giordana Weinheimer L. Lezley Bedgood, PharmD, BCIDP, [place], CPP Clinical Pharmacist Practitioner [facility] 10/16/2019, 2:05 PM

## 2019-10-17 ENCOUNTER — Ambulatory Visit: Payer: BLUE CROSS/BLUE SHIELD | Admitting: Pharmacist

## 2019-10-17 ENCOUNTER — Other Ambulatory Visit: Payer: Self-pay | Admitting: Pharmacist

## 2019-10-17 DIAGNOSIS — Z7253 High risk bisexual behavior: Secondary | ICD-10-CM

## 2019-10-17 LAB — CYTOLOGY, (ORAL, ANAL, URETHRAL) ANCILLARY ONLY
Chlamydia: NEGATIVE
Comment: NEGATIVE
Comment: NORMAL
Neisseria Gonorrhea: NEGATIVE

## 2019-10-17 LAB — URINE CYTOLOGY ANCILLARY ONLY
Chlamydia: NEGATIVE
Comment: NEGATIVE
Comment: NORMAL
Neisseria Gonorrhea: NEGATIVE

## 2019-10-17 LAB — BASIC METABOLIC PANEL
BUN/Creatinine Ratio: 14 (calc) (ref 6–22)
BUN: 18 mg/dL (ref 7–25)
CO2: 30 mmol/L (ref 20–32)
Calcium: 10.1 mg/dL (ref 8.6–10.3)
Chloride: 101 mmol/L (ref 98–110)
Creat: 1.32 mg/dL — ABNORMAL HIGH (ref 0.70–1.25)
Glucose, Bld: 96 mg/dL (ref 65–99)
Potassium: 4.8 mmol/L (ref 3.5–5.3)
Sodium: 139 mmol/L (ref 135–146)

## 2019-10-17 LAB — SARS COV-2 SEROLOGY(COVID-19)AB(IGG,IGM),IMMUNOASSAY
SARS CoV-2 AB IgG: NEGATIVE
SARS CoV-2 IgM: NEGATIVE

## 2019-10-17 LAB — RPR: RPR Ser Ql: NONREACTIVE

## 2019-10-17 LAB — HIV ANTIBODY (ROUTINE TESTING W REFLEX): HIV 1&2 Ab, 4th Generation: NONREACTIVE

## 2019-10-17 MED ORDER — DESCOVY 200-25 MG PO TABS: 1.0000 | ORAL_TABLET | Freq: Every day | ORAL | 2 refills | Status: DC

## 2019-10-17 NOTE — Progress Notes (Signed)
Patient's HIV antibody is negative.  Will send in 3 months of Descovy to [facility].

## 2019-10-21 MED FILL — DESCOVY 200-25 MG TABS: 200-25 | 30 days supply | Qty: 30 | Fill #0

## 2019-10-22 NOTE — Unmapped (Signed)
Patient reports that prescriber wants to temporarily stop Repatha due to total cholesterol being too low. Total cholesterol was 80s at one appointment. He held one dose of the medication and had levels rechecked. Total cholesterol was just 106. Per prescriber instructions, pt is to stop medication and will have levels rechecked in 6-8 weeks. Will reschedule call for then to assess.      [facility] Clinical Assessment & Refill Coordination Note    Jeff Scott, DOB: [birth year]  Phone: [phone] (home)     All above HIPAA information was verified with patient.     Was a Nurse, learning disability used for this call? No    Specialty Medication(s):   General Specialty: Repatha     Current Outpatient Medications   Medication Sig Dispense Refill   ??? empty container Misc Use as directed 1 each PRN   ??? evolocumab (REPATHA SURECLICK) 140 mg/mL PnIj Inject the contents of 1 pen (140mg ) under the skin every other week. 2 mL 3   ??? evolocumab (REPATHA SURECLICK) 140 mg/mL PnIj Inject the contents of 1 pen (140 mg) under the skin every fourteen (14) days. 2 mL 3     No current facility-administered medications for this visit.        Changes to medications: Jeff Scott Reports stopping the following medications: Repatha    Allergies   Allergen Reactions   ??? Penicillin G Rash       Changes to allergies: No    SPECIALTY MEDICATION ADHERENCE     Repatha 140 mg/ml: 14 days of medicine on hand     Medication Adherence    Patient reported X missed doses in the last month: 1  Specialty Medication: Repatha 140 mg/mL  Informant: patient          Specialty medication(s) dose(s) confirmed: Patient reports changes to the regimen as follows: Patient reports that prescriber wants to temporarily stop Repatha due to total cholesterol being too low. Total cholesterol was 80s at one appointment. He held one dose of the medication and had levels rechecked. Total cholesterol was 106. Per prescriber instructions, pt is to stop medication and will have levels rechecked in 6-8 weeks.     Are there any concerns with adherence? No    Adherence counseling provided? Not needed    CLINICAL MANAGEMENT AND INTERVENTION      Clinical Benefit Assessment:    Do you feel the medicine is effective or helping your condition? Yes    Clinical Benefit counseling provided? Not needed    Adverse Effects Assessment:    Are you experiencing any side effects? No    Are you experiencing difficulty administering your medicine? No    Quality of Life Assessment:    How many days over the past month did your high cholesterol  keep you from your normal activities? For example, brushing your teeth or getting up in the morning. 0    Have you discussed this with your provider? Not needed    Therapy Appropriateness:    Is therapy appropriate? Yes, therapy is appropriate    DISEASE/MEDICATION-SPECIFIC INFORMATION      For patients on injectable medications: Patient currently has 1 doses left.      PATIENT SPECIFIC NEEDS     - Does the patient have any physical, cognitive, or cultural barriers? No    - Is the patient high risk? No    - Does the patient require a Care Management Plan? No     -  Does the patient require physician intervention or other additional services (i.e. nutrition, smoking cessation, social work)? No      SHIPPING     Specialty Medication(s) to be Shipped:   None at this time    Other medication(s) to be shipped: No additional medications requested for fill at this time     Changes to insurance: No    Delivery Scheduled: Patient declined refill at this time due to holding medication.     The patient will receive a drug information handout for each medication shipped and additional FDA Medication Guides as required.  Verified that patient has previously received a Conservation officer, historic buildings.    All of the patient's questions and concerns have been addressed.    Jeff Scott   [facility] Shared [facility] Specialty Pharmacist

## 2019-11-13 MED FILL — DESCOVY 200-25 MG TABS: 200-25 | 30 days supply | Qty: 30 | Fill #1

## 2019-12-13 MED FILL — DESCOVY 200-25 MG TABS: 200-25 | 30 days supply | Qty: 30 | Fill #2

## 2019-12-20 NOTE — Unmapped (Signed)
[facility] Shared [facility] Pharmacist Intervention    Type of intervention: Holding medication    Medication: Repatha 140 mg/mL    Problem: Spoke with patient on 10/22/19. At that time, he was instructed by provider to hold Repatha dosing due to low total cholesterol. He was going to have lipid levels rechecked in 6-8 weeks to determine if he should continue holding or restart.    Intervention: Reached out to Mr Nunn today to follow up. He did not provide details but stated firmly that there have been no changes, he is still holding Repatha, and he will call us with any updates. He says there is no need for [place] to call him and waste our time, then he hung up.    Follow up needed: No follow up at this time. Will not reach back out to patient per his request.    Approximate time spent: 15 minutes    Camillo Flaming   [facility] Specialty Pharmacist

## 2019-12-31 NOTE — Unmapped (Signed)
This patient has been disenrolled from the [facility] specialty pharmacy services due to Pt holding medicine due to low cholesterol. Pt requests that we do not call him back.Camillo Flaming  [facility] Specialty Pharmacist

## 2020-01-15 ENCOUNTER — Ambulatory Visit: Payer: BC Managed Care – PPO | Admitting: Pharmacist

## 2020-02-03 ENCOUNTER — Other Ambulatory Visit: Payer: Self-pay | Admitting: Pharmacist

## 2020-02-03 DIAGNOSIS — Z7253 High risk bisexual behavior: Secondary | ICD-10-CM

## 2020-02-03 MED ORDER — DESCOVY 200-25 MG PO TABS: 1.0000 | ORAL_TABLET | Freq: Every day | ORAL | 0 refills | Status: DC

## 2020-02-03 MED FILL — DESCOVY 200-25 MG TABS: 200-25 | 30 days supply | Qty: 30 | Fill #0

## 2020-02-03 NOTE — Progress Notes (Signed)
Patient requests a refill and does not have appt until 12/6. Will send in 30 days to last him until his f/u appt.

## 2020-02-04 ENCOUNTER — Ambulatory Visit: Payer: BC Managed Care – PPO | Admitting: Pharmacist

## 2020-02-17 ENCOUNTER — Other Ambulatory Visit (HOSPITAL_COMMUNITY)
Admission: RE | Admit: 2020-02-17 | Discharge: 2020-02-17 | Disposition: A | Discharge status: Home / Self Care | Payer: BC Managed Care – PPO | Source: Ambulatory Visit | Attending: Infectious Disease | Admitting: Infectious Disease

## 2020-02-17 ENCOUNTER — Ambulatory Visit (INDEPENDENT_AMBULATORY_CARE_PROVIDER_SITE_OTHER): Payer: BC Managed Care – PPO | Admitting: Pharmacist

## 2020-02-17 ENCOUNTER — Other Ambulatory Visit: Payer: Self-pay

## 2020-02-17 DIAGNOSIS — Z7253 High risk bisexual behavior: Secondary | ICD-10-CM | POA: Diagnosis not present

## 2020-02-17 DIAGNOSIS — Z79899 Other long term (current) drug therapy: Secondary | ICD-10-CM

## 2020-02-17 DIAGNOSIS — Z113 Encounter for screening for infections with a predominantly sexual mode of transmission: Secondary | ICD-10-CM | POA: Diagnosis present

## 2020-02-17 NOTE — Progress Notes (Signed)
Date:  02/17/2020   HPI: Richard Hahn is a 59 y.o. male who presents to the [facility] for HIV PrEP follow-up.  Insured   [x]    Uninsured  []    Patient Active Problem List   Diagnosis Date Noted  . Inflammation of bone ([place]) 02/02/2018  . Cervical radiculopathy 01/17/2018  . Arm pain 01/17/2018  . Gonorrhea 11/02/2016  . Exposure to HIV 06/27/2016  . Counseling related to patient's sexual behavior and orientation 06/27/2016  . High risk sexual behavior 04/06/2015  . Encounter for screening for infections with predominantly sexual mode of transmission 04/06/2015  . Other problems related to lifestyle 04/06/2015  . ADHD (attention deficit hyperactivity disorder)   . Low testosterone   . Hypothyroidism   . Neck pain 08/25/2014  . Idiopathic scoliosis 08/25/2014  . Numbness 08/25/2014  . Low back pain radiating to left lower extremity 08/25/2014  . Actinic keratoses 09/06/2011  . Multiple benign melanocytic nevi 09/06/2011    Patient's Medications  New Prescriptions   No medications on file  Previous Medications   ALPRAZOLAM (XANAX) 0.5 MG TABLET    Take 0.5 mg by mouth as needed.    EMTRICITABINE-TENOFOVIR AF (DESCOVY) 200-25 MG TABLET    Take 1 tablet by mouth daily.   ETODOLAC (LODINE) 400 MG TABLET    Take 1 tablet (400 mg total) by mouth 2 (two) times daily.   LIVALO 1 MG TABS    TK 1 T PO 1 TO 2 TIMES A WEEK   MULTIPLE VITAMIN (MULTIVITAMIN) TABLET    Take 1 tablet by mouth daily.   OMEGA-3 ACID ETHYL ESTERS (LOVAZA) 1 G CAPSULE    Take 2 capsules by mouth 2 (two) times daily.   SUMATRIPTAN (IMITREX) 100 MG TABLET    TK 1 T PO PRF HA. MAY REPEAT DOSE ONCE IN 2 HOURS IF NEEDED. MAX OF 2 TS PER DAY.   TESTOSTERONE CYPIONATE (DEPOTESTOTERONE CYPIONATE) 200 MG/ML INJECTION    INJECT 0.8MLS INTO LATERAL THIGH ONCE WEEKLY   THYROID PO    Take 1 Dose by mouth daily. Nature Throid- 3/4 grain   VIAGRA 100 MG TABLET    TK 1 T PO  D PRN   VYVANSE 30 MG CAPSULE        Modified Medications   No medications on file  Discontinued Medications   No medications on file    Allergies: Allergies  Allergen Reactions  . Penicillins Hives    Past Medical History: Past Medical History:  Diagnosis Date  . ADHD (attention deficit hyperactivity disorder)   . Counseling related to patient's sexual behavior and orientation 06/27/2016  . Exposure to HIV 06/27/2016  . Gonorrhea 11/02/2016  . Headache   . High risk sexual behavior 04/06/2015  . Hyperlipidemia   . Hypothyroidism   . Low testosterone   . Screen for STD (sexually transmitted disease) 04/06/2015  . Vision abnormalities     Social History: Social History   Socioeconomic History  . Marital status: Unknown    Spouse name: Not on file  . Number of children: Not on file  . Years of education: Not on file  . Highest education level: Not on file  Occupational History  . Not on file  Tobacco Use  . Smoking status: Never Smoker  . Smokeless tobacco: Never Used  Substance and Sexual Activity  . Alcohol use: No    Alcohol/week: 0.0 standard drinks  . Drug use: No  . Sexual activity: Not on  file  Other Topics Concern  . Not on file  Social History Narrative   Lives   Caffeine use:    Social Determinants of Health   Financial Resource Strain:   . Difficulty of Paying Living Expenses: Not on file  Food Insecurity:   . Worried About Charity fundraiser in the Last Year: Not on file  . Ran Out of Food in the Last Year: Not on file  Transportation Needs:   . Lack of Transportation (Medical): Not on file  . Lack of Transportation (Non-Medical): Not on file  Physical Activity:   . Days of Exercise per Week: Not on file  . Minutes of Exercise per Session: Not on file  Stress:   . Feeling of Stress : Not on file  Social Connections:   . Frequency of Communication with Friends and Family: Not on file  . Frequency of Social Gatherings with Friends and Family: Not on file  . Attends Religious  Services: Not on file  . Active Member of Clubs or Organizations: Not on file  . Attends Archivist Meetings: Not on file  . Marital Status: Not on file    No flowsheet data found.  Labs:  SCr: Lab Results  Component Value Date   CREATININE 1.32 (H) 10/16/2019   CREATININE 1.18 01/02/2017   CREATININE 1.11 11/02/2016   CREATININE 1.19 09/05/2016   CREATININE 1.05 06/27/2016   HIV Lab Results  Component Value Date   HIV NON-REACTIVE 10/16/2019   HIV NON-REACTIVE 01/02/2017   HIV NONREACTIVE 11/02/2016   HIV NONREACTIVE 09/05/2016   HIV NONREACTIVE 06/27/2016   Hepatitis B Lab Results  Component Value Date   HEPBSAG NEGATIVE 04/06/2015   Hepatitis C No results found for: HEPCAB, HCVRNAPCRQN Hepatitis A Lab Results  Component Value Date   HAV NON REACTIVE 04/06/2015   RPR and STI Lab Results  Component Value Date   LABRPR NON-REACTIVE 10/16/2019   LABRPR NON-REACTIVE 01/02/2017   LABRPR NON REAC 11/02/2016   LABRPR NON REAC 09/05/2016   LABRPR NON REAC 06/27/2016    STI Results GC CT  10/16/2019 Negative Negative  10/16/2019 Negative Negative  01/02/2017 Negative Negative  01/02/2017 Negative Negative  01/02/2017 Negative Negative  11/02/2016 Negative Negative  11/02/2016 Negative Negative  11/02/2016 Negative Negative  09/12/2016 Negative Negative  09/05/2016 **POSITIVE**(A) Negative  06/27/2016 Negative Negative  06/27/2016 Negative Negative  06/27/2016 Negative Negative  03/17/2016 Negative Negative  03/17/2016 Negative Negative  03/17/2016 Negative Negative  08/31/2015 *Negative *Negative  08/31/2015 *Negative *Negative  08/31/2015 Negative Negative  06/04/2015 Negative Negative    Assessment: Richard Hahn is here today to follow up for PrEP. I initially saw him back in August and restarted him on PrEP.  He was taking Truvada back in 2018 but had some elevation in his kidney numbers, so he stopped taking it.  He restarted taking Descovy after last visit.  He  has had no issues with Descovy and no side effects. He was taking it daily but started to run low, so he switched to the 2-1-1 method. He has only had a few new partners since August. His SCr was a little elevated when I checked back in August before initiating Descovy, so we will watch it closely.    He requests full labs today so will check those and have him see Marya Amsler in 3 months and then me again 3 months later.   Plan: - HIV antibody, BMET, RPR, urine gonorrhea/chlamydia cytology today - Descovy x 3  months if HIV negative - F/u with Marya Amsler 3/3 at 415pm - F/u with me 6/1 at [place]. Nevaen Tredway, PharmD, BCIDP, AAHIVP, CPP Clinical Pharmacist Practitioner Infectious Diseases [place] [facility] 02/17/2020, 2:03 PM

## 2020-02-18 LAB — URINE CYTOLOGY ANCILLARY ONLY
Chlamydia: NEGATIVE
Comment: NEGATIVE
Comment: NORMAL
Neisseria Gonorrhea: NEGATIVE

## 2020-02-18 LAB — BASIC METABOLIC PANEL
BUN: 14 mg/dL (ref 7–25)
CO2: 25 mmol/L (ref 20–32)
Calcium: 10 mg/dL (ref 8.6–10.3)
Chloride: 101 mmol/L (ref 98–110)
Creat: 1.22 mg/dL (ref 0.70–1.25)
Glucose, Bld: 96 mg/dL (ref 65–99)
Potassium: 4 mmol/L (ref 3.5–5.3)
Sodium: 139 mmol/L (ref 135–146)

## 2020-02-18 LAB — RPR: RPR Ser Ql: NONREACTIVE

## 2020-02-18 LAB — HIV ANTIBODY (ROUTINE TESTING W REFLEX): HIV 1&2 Ab, 4th Generation: NONREACTIVE

## 2020-02-19 ENCOUNTER — Encounter: Payer: Self-pay | Admitting: Pharmacist

## 2020-02-19 ENCOUNTER — Other Ambulatory Visit: Payer: Self-pay | Admitting: Pharmacist

## 2020-02-19 DIAGNOSIS — Z7253 High risk bisexual behavior: Secondary | ICD-10-CM

## 2020-02-19 MED ORDER — DESCOVY 200-25 MG PO TABS: 1.0000 | ORAL_TABLET | Freq: Every day | ORAL | 2 refills | Status: DC

## 2020-02-19 NOTE — Progress Notes (Signed)
Patient's HIV antibody is negative.  Will send in 3 more months of Descovy to [facility].  

## 2020-03-02 MED FILL — DESCOVY 200-25 MG TABS: 200-25 | 30 days supply | Qty: 30 | Fill #0

## 2020-03-31 ENCOUNTER — Telehealth: Payer: Self-pay

## 2020-03-31 MED FILL — DESCOVY 200-25 MG TABS: 200-25 | 30 days supply | Qty: 30 | Fill #1

## 2020-03-31 NOTE — Telephone Encounter (Signed)
[facility] Patient Advocate Encounter °  °Was successful in obtaining a [employer] copay card for Descovy.  This copay card will make the patients copay 0.00. ° °I have spoken with the patient.   ° °The billing information is as follows and has been shared with [facility]. ° ° ° ° ° ° °Manson Luckadoo, CPhT °Specialty Pharmacy Patient Advocate °[facility] °Phone: [phone] °Fax:  [phone]  °

## 2020-04-27 MED FILL — DESCOVY 200-25 MG TABS: 200-25 | 30 days supply | Qty: 30 | Fill #2

## 2020-05-14 ENCOUNTER — Other Ambulatory Visit (HOSPITAL_COMMUNITY)
Admission: RE | Admit: 2020-05-14 | Discharge: 2020-05-14 | Disposition: A | Discharge status: Home / Self Care | Payer: BC Managed Care – PPO | Source: Ambulatory Visit | Attending: Family | Admitting: Family

## 2020-05-14 ENCOUNTER — Encounter: Payer: Self-pay | Admitting: Family

## 2020-05-14 ENCOUNTER — Other Ambulatory Visit: Payer: Self-pay

## 2020-05-14 ENCOUNTER — Ambulatory Visit (INDEPENDENT_AMBULATORY_CARE_PROVIDER_SITE_OTHER): Payer: BC Managed Care – PPO | Admitting: Family

## 2020-05-14 VITALS — BP 132/83 | HR 80 | Temp 98.2°F | Ht 73.0 in | Wt 177.2 lb

## 2020-05-14 DIAGNOSIS — Z7253 High risk bisexual behavior: Secondary | ICD-10-CM

## 2020-05-14 DIAGNOSIS — Z113 Encounter for screening for infections with a predominantly sexual mode of transmission: Secondary | ICD-10-CM | POA: Diagnosis not present

## 2020-05-14 NOTE — Progress Notes (Signed)
Subjective:    Patient ID: Richard Hahn, male    DOB: [birth year], 60 y.o.   MRN: [id number]  Chief Complaint  Patient presents with  . PrEP     HPI:  Richard Hahn is a 60 y.o. male on pre-exposure prophylaxis for HIV last seen on 02/17/20 on Descovy with HIV and STD testing negative. Here today for routine follow up.   Richard Hahn continues to take his Descovy daily with no adverse side effects. Overall feeling well today with no concerns. Has no problems obtaining medications. Declines condoms. Denies fevers, chills, night sweats, headaches, changes in vision, neck pain/stiffness, nausea, diarrhea, vomiting, lesions or rashes.    Allergies  Allergen Reactions  . Penicillins Hives      Outpatient Medications Prior to Visit  Medication Sig Dispense Refill  . ALPRAZolam (XANAX) 0.5 MG tablet Take 0.5 mg by mouth as needed.   1  . emtricitabine-tenofovir AF (DESCOVY) 200-25 MG tablet Take 1 tablet by mouth daily. 30 tablet 2  . etodolac (LODINE) 400 MG tablet Take 1 tablet (400 mg total) by mouth 2 (two) times daily. 60 tablet 5  . LIVALO 1 MG TABS TK 1 T PO 1 TO 2 TIMES A WEEK  2  . Multiple Vitamin (MULTIVITAMIN) tablet Take 1 tablet by mouth daily.    Marland Kitchen omega-3 acid ethyl esters (LOVAZA) 1 G capsule Take 2 capsules by mouth 2 (two) times daily.  3  . SUMAtriptan (IMITREX) 100 MG tablet TK 1 T PO PRF HA. MAY REPEAT DOSE ONCE IN 2 HOURS IF NEEDED. MAX OF 2 TS PER DAY.  0  . testosterone cypionate (DEPOTESTOTERONE CYPIONATE) 200 MG/ML injection INJECT 0.8MLS INTO LATERAL THIGH ONCE WEEKLY  1  . THYROID PO Take 1 Dose by mouth daily. Nature Throid- 3/4 grain    . VIAGRA 100 MG tablet TK 1 T PO  D PRN  0  . VYVANSE 30 MG capsule   0   No facility-administered medications prior to visit.     Past Medical History:  Diagnosis Date  . ADHD (attention deficit hyperactivity disorder)   . Counseling related to patient's sexual behavior and orientation 06/27/2016  . Exposure to HIV  06/27/2016  . Gonorrhea 11/02/2016  . Headache   . High risk sexual behavior 04/06/2015  . Hyperlipidemia   . Hypothyroidism   . Low testosterone   . Screen for STD (sexually transmitted disease) 04/06/2015  . Vision abnormalities      Past Surgical History:  Procedure Laterality Date  . NASAL SINUS SURGERY    . TONSILLECTOMY AND ADENOIDECTOMY         Review of Systems  Constitutional: Negative for appetite change, chills, fatigue, fever and unexpected weight change.  Eyes: Negative for visual disturbance.  Respiratory: Negative for cough, chest tightness, shortness of breath and wheezing.   Cardiovascular: Negative for chest pain and leg swelling.  Gastrointestinal: Negative for abdominal pain, constipation, diarrhea, nausea and vomiting.  Genitourinary: Negative for dysuria, flank pain, frequency, genital sores, hematuria and urgency.  Skin: Negative for rash.  Allergic/Immunologic: Negative for immunocompromised state.  Neurological: Negative for dizziness and headaches.      Objective:    BP 132/83   Pulse 80   Temp 98.2 F (36.8 C) (Oral)   Ht 6\' 1"  (1.854 m)   Wt 177 lb 3.2 oz (80.4 kg)   BMI 23.38 kg/m  Nursing note and vital signs reviewed.  Physical Exam Constitutional:      General:  He is not in acute distress.    Appearance: He is well-developed.  HENT:     Mouth/Throat:     Mouth: Oropharynx is clear and moist.  Eyes:     Conjunctiva/sclera: Conjunctivae normal.  Cardiovascular:     Rate and Rhythm: Normal rate and regular rhythm.     Pulses: Intact distal pulses.     Heart sounds: Normal heart sounds. No murmur heard. No friction rub. No gallop.   Pulmonary:     Effort: Pulmonary effort is normal. No respiratory distress.     Breath sounds: Normal breath sounds. No wheezing or rales.  Chest:     Chest wall: No tenderness.  Abdominal:     General: Bowel sounds are normal.     Palpations: Abdomen is soft.     Tenderness: There is no abdominal  tenderness.  Musculoskeletal:     Cervical back: Neck supple.  Lymphadenopathy:     Cervical: No cervical adenopathy.  Skin:    General: Skin is warm and dry.     Findings: No rash.  Neurological:     Mental Status: He is alert and oriented to person, place, and time.  Psychiatric:        Mood and Affect: Mood and affect normal.        Behavior: Behavior normal.        Thought Content: Thought content normal.        Judgment: Judgment normal.      Depression screen [facility] 2/9 11/02/2016 06/27/2016 08/31/2015 04/06/2015  Decreased Interest 0 0 0 0  Down, Depressed, Hopeless 0 0 0 0  PHQ - 2 Score 0 0 0 0       Assessment & Plan:    Patient Active Problem List   Diagnosis Date Noted  . Inflammation of bone ([place]) 02/02/2018  . Cervical radiculopathy 01/17/2018  . Arm pain 01/17/2018  . Gonorrhea 11/02/2016  . Exposure to HIV 06/27/2016  . Counseling related to patient's sexual behavior and orientation 06/27/2016  . High risk sexual behavior 04/06/2015  . Encounter for screening for infections with predominantly sexual mode of transmission 04/06/2015  . Other problems related to lifestyle 04/06/2015  . ADHD (attention deficit hyperactivity disorder)   . Low testosterone   . Hypothyroidism   . Neck pain 08/25/2014  . Idiopathic scoliosis 08/25/2014  . Numbness 08/25/2014  . Low back pain radiating to left lower extremity 08/25/2014  . Actinic keratoses 09/06/2011  . Multiple benign melanocytic nevi 09/06/2011     Problem List Items Addressed This Visit      Other   High risk sexual behavior - Primary    Richard Hahn continues to take Descovy with no adverse side effects. Check HIV and STI today. Continue current dose of Descovy. Discussed importance of safe sexual practice to reduce risk of STI. Condoms declined. Plan for follow up in 3 months or sooner if needed.       Relevant Orders   Cytology (oral, anal, urethral) ancillary only   Cytology (oral, anal, urethral)  ancillary only   RPR (Completed)   HIV Antibody (routine testing w rflx) (Completed)   Urine cytology ancillary only([place])    Other Visit Diagnoses    Screening for STDs (sexually transmitted diseases)           I am having Richard Hahn maintain his testosterone cypionate, ALPRAZolam, omega-3 acid ethyl esters, multivitamin, Vyvanse, SUMAtriptan, Livalo, Viagra, THYROID PO, etodolac, and Descovy.   No orders of  the defined types were placed in this encounter.    Follow-up: Return in about 3 months (around 08/14/2020).   Terri Piedra, MSN, FNP-C Nurse Practitioner [facility] for [facility] [place] number: [phone]

## 2020-05-14 NOTE — Patient Instructions (Signed)
Nice to see you.  We will check your lab work today.  Continue to take your Descovy daily as prescribed.  Refills are at the pharmacy.  Plan for follow up in 3 months or sooner if needed.   Have a great day and stay safe!

## 2020-05-15 LAB — RPR: RPR Ser Ql: NONREACTIVE

## 2020-05-15 LAB — HIV ANTIBODY (ROUTINE TESTING W REFLEX): HIV 1&2 Ab, 4th Generation: NONREACTIVE

## 2020-05-15 NOTE — Assessment & Plan Note (Signed)
Richard Hahn continues to take Descovy with no adverse side effects. Check HIV and STI today. Continue current dose of Descovy. Discussed importance of safe sexual practice to reduce risk of STI. Condoms declined. Plan for follow up in 3 months or sooner if needed.

## 2020-05-18 ENCOUNTER — Other Ambulatory Visit: Payer: Self-pay | Admitting: Family

## 2020-05-18 LAB — CYTOLOGY, (ORAL, ANAL, URETHRAL) ANCILLARY ONLY
Chlamydia: NEGATIVE
Chlamydia: POSITIVE — AB
Comment: NEGATIVE
Comment: NEGATIVE
Comment: NORMAL
Comment: NORMAL
Neisseria Gonorrhea: NEGATIVE
Neisseria Gonorrhea: NEGATIVE

## 2020-05-18 LAB — URINE CYTOLOGY ANCILLARY ONLY
Chlamydia: NEGATIVE
Comment: NEGATIVE
Comment: NORMAL
Neisseria Gonorrhea: NEGATIVE

## 2020-05-18 MED ORDER — DOXYCYCLINE HYCLATE 100 MG PO TABS: 100.0000 mg | ORAL_TABLET | Freq: Two times a day (BID) | ORAL | 0 refills | Status: AC

## 2020-05-21 ENCOUNTER — Telehealth: Payer: Self-pay

## 2020-05-21 ENCOUNTER — Other Ambulatory Visit: Payer: Self-pay | Admitting: Family

## 2020-05-21 ENCOUNTER — Other Ambulatory Visit: Payer: Self-pay | Admitting: Pharmacist

## 2020-05-21 DIAGNOSIS — Z7253 High risk bisexual behavior: Secondary | ICD-10-CM

## 2020-05-21 NOTE — Telephone Encounter (Signed)
Patient called asking about his positive chlamydia results. Patient asked if he could still have sex while taking the antibiotics for chlamydia, I have advised patient no sex until after treatment is completed and partner has completed treatment. Patient stated his partner started doxycycline as well yesterday.  Patient has been advised no sex for 10 days after treatment. Patient verbalized understanding. Richard Hahn Richard Hahn

## 2020-06-11 ENCOUNTER — Other Ambulatory Visit (HOSPITAL_COMMUNITY): Payer: Self-pay

## 2020-06-18 ENCOUNTER — Other Ambulatory Visit (HOSPITAL_COMMUNITY): Payer: Self-pay

## 2020-06-23 ENCOUNTER — Other Ambulatory Visit (HOSPITAL_COMMUNITY): Payer: Self-pay

## 2020-07-15 ENCOUNTER — Other Ambulatory Visit (HOSPITAL_COMMUNITY): Payer: Self-pay

## 2020-08-12 ENCOUNTER — Other Ambulatory Visit: Payer: Self-pay

## 2020-08-12 ENCOUNTER — Other Ambulatory Visit (HOSPITAL_COMMUNITY)
Admission: RE | Admit: 2020-08-12 | Discharge: 2020-08-12 | Disposition: A | Discharge status: Home / Self Care | Payer: BC Managed Care – PPO | Source: Ambulatory Visit | Attending: Family | Admitting: Family

## 2020-08-12 ENCOUNTER — Ambulatory Visit: Payer: BC Managed Care – PPO | Admitting: Pharmacist

## 2020-08-12 ENCOUNTER — Other Ambulatory Visit (HOSPITAL_COMMUNITY): Payer: Self-pay

## 2020-08-12 ENCOUNTER — Ambulatory Visit (INDEPENDENT_AMBULATORY_CARE_PROVIDER_SITE_OTHER): Payer: BC Managed Care – PPO | Admitting: Pharmacist

## 2020-08-12 DIAGNOSIS — Z79899 Other long term (current) drug therapy: Secondary | ICD-10-CM

## 2020-08-12 DIAGNOSIS — Z113 Encounter for screening for infections with a predominantly sexual mode of transmission: Secondary | ICD-10-CM

## 2020-08-12 DIAGNOSIS — Z7253 High risk bisexual behavior: Secondary | ICD-10-CM | POA: Diagnosis not present

## 2020-08-12 MED FILL — Emtricitabine-Tenofovir Alafenamide Fumarate Tab 200-25 MG: ORAL | 30 days supply | Qty: 30 | Fill #0 | Status: AC

## 2020-08-12 NOTE — Progress Notes (Signed)
Date:  08/12/2020   HPI: Richard Hahn is a 60 y.o. male who presents to the [place] clinic for HIV PrEP follow-up.  Insured   [x]    Uninsured  []    Patient Active Problem List   Diagnosis Date Noted  . Inflammation of bone ([place]) 02/02/2018  . Cervical radiculopathy 01/17/2018  . Arm pain 01/17/2018  . Gonorrhea 11/02/2016  . Exposure to HIV 06/27/2016  . Counseling related to patient's sexual behavior and orientation 06/27/2016  . High risk sexual behavior 04/06/2015  . Encounter for screening for infections with predominantly sexual mode of transmission 04/06/2015  . Other problems related to lifestyle 04/06/2015  . ADHD (attention deficit hyperactivity disorder)   . Low testosterone   . Hypothyroidism   . Neck pain 08/25/2014  . Idiopathic scoliosis 08/25/2014  . Numbness 08/25/2014  . Low back pain radiating to left lower extremity 08/25/2014  . Actinic keratoses 09/06/2011  . Multiple benign melanocytic nevi 09/06/2011    Patient's Medications  New Prescriptions   No medications on file  Previous Medications   ALPRAZOLAM (XANAX) 0.5 MG TABLET    Take 0.5 mg by mouth as needed.    DOXYCYCLINE (VIBRA-TABS) 100 MG TABLET    Take 1 tablet (100 mg total) by mouth 2 (two) times daily.   EMTRICITABINE-TENOFOVIR AF (DESCOVY) 200-25 MG TABLET    TAKE 1 TABLET BY MOUTH DAILY.   ETODOLAC (LODINE) 400 MG TABLET    Take 1 tablet (400 mg total) by mouth 2 (two) times daily.   LIVALO 1 MG TABS    TK 1 T PO 1 TO 2 TIMES A WEEK   MULTIPLE VITAMIN (MULTIVITAMIN) TABLET    Take 1 tablet by mouth daily.   OMEGA-3 ACID ETHYL ESTERS (LOVAZA) 1 G CAPSULE    Take 2 capsules by mouth 2 (two) times daily.   SUMATRIPTAN (IMITREX) 100 MG TABLET    TK 1 T PO PRF HA. MAY REPEAT DOSE ONCE IN 2 HOURS IF NEEDED. MAX OF 2 TS PER DAY.   TESTOSTERONE CYPIONATE (DEPOTESTOTERONE CYPIONATE) 200 MG/ML INJECTION    INJECT 0.8MLS INTO LATERAL THIGH ONCE WEEKLY   THYROID PO    Take 1 Dose by mouth daily.  Nature Throid- 3/4 grain   VIAGRA 100 MG TABLET    TK 1 T PO  D PRN   VYVANSE 30 MG CAPSULE      Modified Medications   No medications on file  Discontinued Medications   No medications on file    Allergies: Allergies  Allergen Reactions  . Penicillins Hives    Past Medical History: Past Medical History:  Diagnosis Date  . ADHD (attention deficit hyperactivity disorder)   . Counseling related to patient's sexual behavior and orientation 06/27/2016  . Exposure to HIV 06/27/2016  . Gonorrhea 11/02/2016  . Headache   . High risk sexual behavior 04/06/2015  . Hyperlipidemia   . Hypothyroidism   . Low testosterone   . Screen for STD (sexually transmitted disease) 04/06/2015  . Vision abnormalities     Social History: Social History   Socioeconomic History  . Marital status: Unknown    Spouse name: Not on file  . Number of children: Not on file  . Years of education: Not on file  . Highest education level: Not on file  Occupational History  . Not on file  Tobacco Use  . Smoking status: Never Smoker  . Smokeless tobacco: Never Used  Substance and Sexual Activity  .  Alcohol use: No    Alcohol/week: 0.0 standard drinks  . Drug use: No  . Sexual activity: Not on file  Other Topics Concern  . Not on file  Social History Narrative   Lives   Caffeine use:    Social Determinants of Health   Financial Resource Strain: Not on file  Food Insecurity: Not on file  Transportation Needs: Not on file  Physical Activity: Not on file  Stress: Not on file  Social Connections: Not on file    No flowsheet data found.  Labs:  SCr: Lab Results  Component Value Date   CREATININE 1.22 02/17/2020   CREATININE 1.32 (H) 10/16/2019   CREATININE 1.18 01/02/2017   CREATININE 1.11 11/02/2016   CREATININE 1.19 09/05/2016   HIV Lab Results  Component Value Date   HIV NON-REACTIVE 05/14/2020   HIV NON-REACTIVE 02/17/2020   HIV NON-REACTIVE 10/16/2019   HIV NON-REACTIVE  01/02/2017   HIV NONREACTIVE 11/02/2016   Hepatitis B Lab Results  Component Value Date   HEPBSAG NEGATIVE 04/06/2015   Hepatitis C No results found for: HEPCAB, HCVRNAPCRQN Hepatitis A Lab Results  Component Value Date   HAV NON REACTIVE 04/06/2015   RPR and STI Lab Results  Component Value Date   LABRPR NON-REACTIVE 05/14/2020   LABRPR NON-REACTIVE 02/17/2020   LABRPR NON-REACTIVE 10/16/2019   LABRPR NON-REACTIVE 01/02/2017   LABRPR NON REAC 11/02/2016    STI Results GC CT  05/14/2020 Negative Negative  05/14/2020 Negative Positive(A)  05/14/2020 Negative Negative  02/17/2020 Negative Negative  10/16/2019 Negative Negative  10/16/2019 Negative Negative  01/02/2017 Negative Negative  01/02/2017 Negative Negative  01/02/2017 Negative Negative  11/02/2016 Negative Negative  11/02/2016 Negative Negative  11/02/2016 Negative Negative  09/12/2016 Negative Negative  09/05/2016 **POSITIVE**(A) Negative  06/27/2016 Negative Negative  06/27/2016 Negative Negative  06/27/2016 Negative Negative  03/17/2016 Negative Negative  03/17/2016 Negative Negative  03/17/2016 Negative Negative    Assessment: Richard Hahn is here today to follow up for HIV PrEP. He takes Descovy on a 2-1-1 basis and has had no issues with it thus far. He did test positive for rectal chlamydia at his last visit and was treated with doxycycline. Will re-screen him today. No concerns. Will check labs and see him back in 3 months.   Plan: - HIV antibody, BMET, RPR, urine/oral/rectal gonorrhea/chlamydia cytology today - Descovy x 3 months if HIV negative - F/u in 3 months  Horst Ostermiller L. Bolton Canupp, PharmD, BCIDP, [place], CPP Clinical Pharmacist Practitioner Infectious Diseases [place] [facility] 08/12/2020, 3:35 PM

## 2020-08-13 LAB — BASIC METABOLIC PANEL
BUN/Creatinine Ratio: 13 (calc) (ref 6–22)
BUN: 17 mg/dL (ref 7–25)
CO2: 28 mmol/L (ref 20–32)
Calcium: 9.6 mg/dL (ref 8.6–10.3)
Chloride: 101 mmol/L (ref 98–110)
Creat: 1.3 mg/dL — ABNORMAL HIGH (ref 0.70–1.25)
Glucose, Bld: 89 mg/dL (ref 65–99)
Potassium: 4.8 mmol/L (ref 3.5–5.3)
Sodium: 138 mmol/L (ref 135–146)

## 2020-08-13 LAB — HIV ANTIBODY (ROUTINE TESTING W REFLEX): HIV 1&2 Ab, 4th Generation: NONREACTIVE

## 2020-08-13 LAB — RPR: RPR Ser Ql: NONREACTIVE

## 2020-08-14 LAB — URINE CYTOLOGY ANCILLARY ONLY
Chlamydia: NEGATIVE
Comment: NEGATIVE
Comment: NORMAL
Neisseria Gonorrhea: NEGATIVE

## 2020-08-14 LAB — CYTOLOGY, (ORAL, ANAL, URETHRAL) ANCILLARY ONLY
Chlamydia: NEGATIVE
Chlamydia: NEGATIVE
Comment: NEGATIVE
Comment: NEGATIVE
Comment: NORMAL
Comment: NORMAL
Neisseria Gonorrhea: NEGATIVE
Neisseria Gonorrhea: NEGATIVE

## 2020-08-17 ENCOUNTER — Other Ambulatory Visit: Payer: Self-pay | Admitting: Pharmacist

## 2020-08-17 ENCOUNTER — Encounter: Payer: Self-pay | Admitting: Pharmacist

## 2020-08-17 ENCOUNTER — Other Ambulatory Visit (HOSPITAL_COMMUNITY): Payer: Self-pay

## 2020-08-17 DIAGNOSIS — Z7253 High risk bisexual behavior: Secondary | ICD-10-CM

## 2020-08-17 MED ORDER — EMTRICITABINE-TENOFOVIR AF 200-25 MG PO TABS
1.0000 | ORAL_TABLET | Freq: Every day | ORAL | 2 refills | Status: AC
  Filled 2020-08-17: qty 30, 30d supply, fill #0

## 2020-08-17 NOTE — Progress Notes (Signed)
Patient's HIV antibody is negative.  Will send in 3 more months of Descovy to [facility].  

## 2020-09-08 ENCOUNTER — Other Ambulatory Visit (HOSPITAL_COMMUNITY): Payer: Self-pay

## 2020-10-09 ENCOUNTER — Other Ambulatory Visit (HOSPITAL_COMMUNITY): Payer: Self-pay

## 2020-10-12 ENCOUNTER — Other Ambulatory Visit (HOSPITAL_COMMUNITY): Payer: Self-pay

## 2020-10-13 ENCOUNTER — Other Ambulatory Visit (HOSPITAL_COMMUNITY): Payer: Self-pay

## 2020-10-14 ENCOUNTER — Ambulatory Visit: Payer: BC Managed Care – PPO | Admitting: Pharmacist

## 2020-11-11 ENCOUNTER — Ambulatory Visit: Payer: BC Managed Care – PPO | Admitting: Pharmacist

## 2020-11-12 ENCOUNTER — Other Ambulatory Visit (HOSPITAL_COMMUNITY): Payer: Self-pay

## 2020-11-17 ENCOUNTER — Other Ambulatory Visit (HOSPITAL_COMMUNITY): Payer: Self-pay

## 2020-11-19 ENCOUNTER — Other Ambulatory Visit (HOSPITAL_COMMUNITY): Payer: Self-pay

## 2020-12-11 ENCOUNTER — Encounter: Payer: Self-pay | Admitting: Pharmacist

## 2023-12-13 ENCOUNTER — Other Ambulatory Visit: Payer: Self-pay | Admitting: Physician Assistant

## 2023-12-13 DIAGNOSIS — M79661 Pain in right lower leg: Secondary | ICD-10-CM

## 2023-12-13 DIAGNOSIS — I8391 Asymptomatic varicose veins of right lower extremity: Secondary | ICD-10-CM

## 2023-12-14 ENCOUNTER — Ambulatory Visit (HOSPITAL_BASED_OUTPATIENT_CLINIC_OR_DEPARTMENT_OTHER)
Admission: RE | Admit: 2023-12-14 | Discharge: 2023-12-14 | Disposition: A | Discharge status: Home / Self Care | Source: Ambulatory Visit | Attending: Physician Assistant | Admitting: Physician Assistant

## 2023-12-14 DIAGNOSIS — I8391 Asymptomatic varicose veins of right lower extremity: Secondary | ICD-10-CM | POA: Insufficient documentation

## 2023-12-14 DIAGNOSIS — M79661 Pain in right lower leg: Secondary | ICD-10-CM | POA: Insufficient documentation
